# Patient Record
Sex: Female | Born: 1980 | Race: White | Hispanic: No | Marital: Married | Smoking: Never smoker
Health system: Southern US, Community
[De-identification: ages and names within clinical notes are randomized; demographics above are authoritative.]

## PROBLEM LIST (undated history)

## (undated) DIAGNOSIS — I471 Supraventricular tachycardia, unspecified: Secondary | ICD-10-CM

## (undated) HISTORY — DX: Supraventricular tachycardia: I47.1

## (undated) HISTORY — DX: Supraventricular tachycardia, unspecified: I47.10

---
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
## 2008-11-25 ENCOUNTER — Observation Stay: Payer: Self-pay

## 2008-11-28 ENCOUNTER — Inpatient Hospital Stay: Payer: Self-pay

## 2008-12-05 ENCOUNTER — Observation Stay: Payer: Self-pay | Admitting: Obstetrics & Gynecology

## 2008-12-09 ENCOUNTER — Observation Stay: Payer: Self-pay

## 2008-12-12 ENCOUNTER — Observation Stay: Payer: Self-pay | Admitting: Obstetrics and Gynecology

## 2008-12-19 ENCOUNTER — Observation Stay: Payer: Self-pay

## 2008-12-23 ENCOUNTER — Observation Stay: Payer: Self-pay | Admitting: Obstetrics and Gynecology

## 2008-12-26 ENCOUNTER — Inpatient Hospital Stay: Payer: Self-pay | Admitting: Unknown Physician Specialty

## 2010-11-17 ENCOUNTER — Emergency Department: Payer: Self-pay | Admitting: Emergency Medicine

## 2010-11-19 ENCOUNTER — Encounter: Payer: Self-pay | Admitting: Cardiovascular Disease

## 2010-11-19 ENCOUNTER — Ambulatory Visit (INDEPENDENT_AMBULATORY_CARE_PROVIDER_SITE_OTHER): Payer: PRIVATE HEALTH INSURANCE | Admitting: Cardiovascular Disease

## 2010-11-19 DIAGNOSIS — R079 Chest pain, unspecified: Secondary | ICD-10-CM

## 2010-11-19 NOTE — Progress Notes (Signed)
   Patient ID: Leslie Chapman, female    DOB: Jan 09, 1981, 30 y.o.   MRN: [id number]  HPI Comments: Leslie Chapman is a very pleasant 30 year old woman with no significant cardiac history, mother of 4 children, who works third shift at the [employer], who presents for followup after recent episode of chest tightness.   She reports that over the course of the past week or so, she has had stuttering chest tightness. She presented to the emergency room after episode started on Sunday into Monday. She described it as a squeezing in the upper part of her mediastinum radiating bilaterally. She has had more mild symptoms of this like a pill was stuck in her throat, in the past. Workup in the hospital was essentially negative. She had just got back from the beach and had a borderline elevated d-dimer, negative CTA of the chest, negative cardiac enzymes, normal EKG. Other lab work looked essentially normal.  She continues to have mild symptoms through the week. No exacerbation with exertion. She continues to take care of 4 children. No significant exacerbation of her symptoms with eating.  EKG shows normal sinus rhythm with rate 90 beats per minute with no significant ST or T wave changes     Review of Systems  Constitutional: Negative.   HENT: Negative.   Eyes: Negative.   Respiratory: Positive for chest tightness.   Cardiovascular: Positive for chest pain.  Gastrointestinal: Negative.   Musculoskeletal: Negative.   Skin: Negative.   Neurological: Negative.   Hematological: Negative.   Psychiatric/Behavioral: Negative.   All other systems reviewed and are negative.    BP 136/83  Pulse 88  Ht 5\' 4"  (1.626 m)  Wt 220 lb (99.791 kg)  BMI 37.76 kg/m2  Physical Exam  Nursing note and vitals reviewed. Constitutional: She is oriented to person, place, and time. She appears well-developed and well-nourished.  HENT:  Head: Normocephalic.  Nose: Nose normal.  Mouth/Throat: Oropharynx is clear  and moist.  Eyes: Conjunctivae are normal. Pupils are equal, round, and reactive to light.  Neck: Normal range of motion. Neck supple. No JVD present.  Cardiovascular: Normal rate, regular rhythm, S1 normal, S2 normal, normal heart sounds and intact distal pulses.  Exam reveals no gallop and no friction rub.   No murmur heard. Pulmonary/Chest: Effort normal and breath sounds normal. No respiratory distress. She has no wheezes. She has no rales. She exhibits no tenderness.  Abdominal: Soft. Bowel sounds are normal. She exhibits no distension. There is no tenderness.  Musculoskeletal: Normal range of motion. She exhibits no edema and no tenderness.  Lymphadenopathy:    She has no cervical adenopathy.  Neurological: She is alert and oriented to person, place, and time. Coordination normal.  Skin: Skin is warm and dry. No rash noted. No erythema.  Psychiatric: She has a normal mood and affect. Her behavior is normal. Judgment and thought content normal.         Assessment and Plan

## 2010-11-19 NOTE — Patient Instructions (Addendum)
Please try omeprazole/prevacid one a day (could try two a day if bad) Try this for a month (take 1 to 2 weeks to work) Zantac and pepcid will wear off in several hours (you can double the dose)  You can try aleve or advil for chest discomfort (this will help if symptoms are from musculoskeletal problem) Please call us if you have new issues that need to be addressed   Follow up as needed if symptoms do not improve.

## 2010-11-19 NOTE — Assessment & Plan Note (Signed)
Chest pain is likely noncardiac. Symptoms come on at rest. There is more GI symptoms that she details. We have suggested she try proton pump inhibitors, possibly even H2 blockers in combination for her symptoms. She does report some discomfort with palpation of her chest and she could try NSAIDs such as Aleve or Advil for short periods of time. Unable to exclude esophageal spasm or hiatal hernia, esophagitis or gastritis. If she does have spasm with no relief on PPI, we could try ca channel blockers or long-acting nitrates. I have asked her to keep in touch with our office if her symptoms do not improve. If needed, we can perform a treadmill study.

## 2011-01-06 ENCOUNTER — Encounter: Payer: Self-pay | Admitting: Cardiovascular Disease

## 2014-06-03 ENCOUNTER — Ambulatory Visit: Payer: Self-pay | Admitting: Registered Nurse

## 2014-12-04 ENCOUNTER — Ambulatory Visit (INDEPENDENT_AMBULATORY_CARE_PROVIDER_SITE_OTHER): Payer: 59 | Admitting: Family Medicine

## 2014-12-04 ENCOUNTER — Encounter: Payer: Self-pay | Admitting: Family Medicine

## 2014-12-04 VITALS — BP 129/88 | HR 109 | Temp 98.7°F | Ht 64.75 in | Wt 252.0 lb

## 2014-12-04 DIAGNOSIS — R000 Tachycardia, unspecified: Secondary | ICD-10-CM

## 2014-12-04 DIAGNOSIS — R600 Localized edema: Secondary | ICD-10-CM | POA: Diagnosis not present

## 2014-12-04 DIAGNOSIS — Z0000 Encounter for general adult medical examination without abnormal findings: Secondary | ICD-10-CM | POA: Insufficient documentation

## 2014-12-04 DIAGNOSIS — Z3481 Encounter for supervision of other normal pregnancy, first trimester: Secondary | ICD-10-CM

## 2014-12-04 DIAGNOSIS — R0789 Other chest pain: Secondary | ICD-10-CM | POA: Diagnosis not present

## 2014-12-04 DIAGNOSIS — Z3491 Encounter for supervision of normal pregnancy, unspecified, first trimester: Secondary | ICD-10-CM | POA: Insufficient documentation

## 2014-12-04 DIAGNOSIS — Z862 Personal history of diseases of the blood and blood-forming organs and certain disorders involving the immune mechanism: Secondary | ICD-10-CM | POA: Diagnosis not present

## 2014-12-04 LAB — CBC WITH DIFFERENTIAL/PLATELET
HEMATOCRIT: 36.9 % (ref 34.0–46.6)
HEMOGLOBIN: 12.9 g/dL (ref 11.1–15.9)
Lymphocytes Absolute: 0.9 10*3/uL (ref 0.7–3.1)
Lymphs: 20 %
MCH: 31 pg (ref 26.6–33.0)
MCHC: 35 g/dL (ref 31.5–35.7)
MCV: 88 fL (ref 79–97)
MID (Absolute): 0.2 10*3/uL (ref 0.1–1.6)
MID: 5 %
NEUTROS PCT: 76 %
Neutrophils Absolute: 3.6 10*3/uL (ref 1.4–7.0)
Platelets: 209 10*3/uL (ref 150–379)
RBC: 4.16 x10E6/uL (ref 3.77–5.28)
RDW: 13.8 % (ref 12.3–15.4)
WBC: 4.7 10*3/uL (ref 3.4–10.8)

## 2014-12-04 NOTE — Assessment & Plan Note (Signed)
Chewable multivitamins; appt with OB next week

## 2014-12-04 NOTE — Assessment & Plan Note (Signed)
Echocardiogram ordered to evaluate LVEF; more likely related to prolonged sitting and sodium intake; glad that she has reduced both

## 2014-12-04 NOTE — Patient Instructions (Addendum)
We'll refer you for an echocardiogram and a holter monitor Do take two of the Flintstone chewables until you see the OB doctor for further directions We'll contact you about lab results Try DASH guidelines  DASH Eating Plan DASH stands for "Dietary Approaches to Stop Hypertension." The DASH eating plan is a healthy eating plan that has been shown to reduce high blood pressure (hypertension). Additional health benefits may include reducing the risk of type 2 diabetes mellitus, heart disease, and stroke. The DASH eating plan may also help with weight loss. WHAT DO I NEED TO KNOW ABOUT THE DASH EATING PLAN? For the DASH eating plan, you will follow these general guidelines:  Choose foods with a percent daily value for sodium of less than 5% (as listed on the food label).  Use salt-free seasonings or herbs instead of table salt or sea salt.  Check with your health care provider or pharmacist before using salt substitutes.  Eat lower-sodium products, often labeled as "lower sodium" or "no salt added."  Eat fresh foods.  Eat more vegetables, fruits, and low-fat dairy products.  Choose whole grains. Look for the word "whole" as the first word in the ingredient list.  Choose fish and skinless chicken or [place] more often than red meat. Limit fish, poultry, and meat to 6 oz (170 g) each day.  Limit sweets, desserts, sugars, and sugary drinks.  Choose heart-healthy fats.  Limit cheese to 1 oz (28 g) per day.  Eat more home-cooked food and less restaurant, buffet, and fast food.  Limit fried foods.  Cook foods using methods other than frying.  Limit canned vegetables. If you do use them, rinse them well to decrease the sodium.  When eating at a restaurant, ask that your food be prepared with less salt, or no salt if possible. WHAT FOODS CAN I EAT? Seek help from a dietitian for individual calorie needs. Grains Whole grain or whole wheat bread. Brown rice. Whole grain or whole wheat  pasta. Quinoa, bulgur, and whole grain cereals. Low-sodium cereals. Corn or whole wheat flour tortillas. Whole grain cornbread. Whole grain crackers. Low-sodium crackers. Vegetables Fresh or frozen vegetables (raw, steamed, roasted, or grilled). Low-sodium or reduced-sodium tomato and vegetable juices. Low-sodium or reduced-sodium tomato sauce and paste. Low-sodium or reduced-sodium canned vegetables.  Fruits All fresh, canned (in natural juice), or frozen fruits. Meat and Other Protein Products Ground beef (85% or leaner), grass-fed beef, or beef trimmed of fat. Skinless chicken or [place]. Ground chicken or [place]. Pork trimmed of fat. All fish and seafood. Eggs. Dried beans, peas, or lentils. Unsalted nuts and seeds. Unsalted canned beans. Dairy Low-fat dairy products, such as skim or 1% milk, 2% or reduced-fat cheeses, low-fat ricotta or cottage cheese, or plain low-fat yogurt. Low-sodium or reduced-sodium cheeses. Fats and Oils Tub margarines without trans fats. Light or reduced-fat mayonnaise and salad dressings (reduced sodium). Avocado. Safflower, olive, or canola oils. Natural peanut or almond butter. Other Unsalted popcorn and pretzels. The items listed above may not be a complete list of recommended foods or beverages. Contact your dietitian for more options. WHAT FOODS ARE NOT RECOMMENDED? Grains White bread. White pasta. White rice. Refined cornbread. Bagels and croissants. Crackers that contain trans fat. Vegetables Creamed or fried vegetables. Vegetables in a cheese sauce. Regular canned vegetables. Regular canned tomato sauce and paste. Regular tomato and vegetable juices. Fruits Dried fruits. Canned fruit in light or heavy syrup. Fruit juice. Meat and Other Protein Products Fatty cuts of meat. Ribs, chicken wings,  bacon, sausage, bologna, salami, chitterlings, fatback, hot dogs, bratwurst, and packaged luncheon meats. Salted nuts and seeds. Canned beans with salt. Dairy Whole  or 2% milk, cream, half-and-half, and cream cheese. Whole-fat or sweetened yogurt. Full-fat cheeses or blue cheese. Nondairy creamers and whipped toppings. Processed cheese, cheese spreads, or cheese curds. Condiments Onion and garlic salt, seasoned salt, table salt, and sea salt. Canned and packaged gravies. Worcestershire sauce. Tartar sauce. Barbecue sauce. Teriyaki sauce. Soy sauce, including reduced sodium. Steak sauce. Fish sauce. Oyster sauce. Cocktail sauce. Horseradish. Ketchup and mustard. Meat flavorings and tenderizers. Bouillon cubes. Hot sauce. Tabasco sauce. Marinades. Taco seasonings. Relishes. Fats and Oils Butter, stick margarine, lard, shortening, ghee, and bacon fat. Coconut, palm kernel, or palm oils. Regular salad dressings. Other Pickles and olives. Salted popcorn and pretzels. The items listed above may not be a complete list of foods and beverages to avoid. Contact your dietitian for more information. WHERE CAN I FIND MORE INFORMATION? National Heart, Lung, and Blood Institute: travelstabloid.com Document Released: 03/04/2011 Document Revised: 07/30/2013 Document Reviewed: 01/17/2013 [facility] Patient Information 2015 [place], [place]. This information is not intended to replace advice given to you by your health care provider. Make sure you discuss any questions you have with your health care provider.

## 2014-12-04 NOTE — Assessment & Plan Note (Addendum)
CBC checked today, no anemia; ferritin pending; she does not get much dark, green leafy vegetables

## 2014-12-04 NOTE — Assessment & Plan Note (Signed)
EKG done today; will get holter monitor and echo; she already saw cardiologist a few years ago; will evaluate for MVP

## 2014-12-04 NOTE — Progress Notes (Signed)
BP 129/88 mmHg  Pulse 109  Temp(Src) 98.7 F (37.1 C)  Ht 5' 4.75" (1.645 m)  Wt 252 lb (114.306 kg)  BMI 42.24 kg/m2  SpO2 97%   Subjective:    Patient ID: Leslie Chapman, female    DOB: 09/18/1980, 34 y.o.   MRN: [id number]  HPI: Leslie Chapman is a 34 y.o. female  Chief Complaint  Patient presents with  . Establish Care  . Edema    bilateral ankles, works on the weekends  . Tachycardia    x 6 months or so   She is here to establish care She could feel her heart pounding, rate of 120 sitting there doing nothing She tried to drink a lot of fluids Last Saturday, period was late; not very regular anyway so she got a test and found out she was pregnant She has appt with OB next week Rarely will flutter, but mostly just pounding and hard, working hard and going really fast; going on for 6 months Hair will fall out, not abnormal; might be thinning a little in the front; face is very oily; bowel movements regular; doesn't go every day but maybe every other day No known thyroid trouble; her father has heart attacks, he was 51 or 11 years old; no known fam hx of pacemakers She does get chest pain at times; not sure how to describe it; not a particular time; does not last long; does not usually exercise, "I am overweight and I'm very aware of that"; she had some nausea the other night but that might have been the pregnancy Takes a while to get back to normal after three 12 hour shifts in a row No regular meds Does not swallow pills regularly; taking Flintstone chewable The swelling in her ankles is worse on the weekends; notices if riding in a car for a long time; takes 2 days to resolve after she has worked three 12 hour shifts; shoes are tight; not really different eating; cut out soft drinks; stopped adding salt to the food and really no change; does not get up much at her desk I reviewed chart; she saw Dr. Rockey Situ for chest pain in 2012; his assessment for chest pain was  reviewed by me today She says she gets frequent strep throat; had Group B last time; felt different from her usual Group A strep, she could tell the difference She weighs at work; was 259 pounds last time; same kind of scale, not sure about the 7 pounds difference unless it was all swelling in her legs  Relevant past medical, surgical, family and social history reviewed and updated as indicated. Interim medical history since our last visit reviewed. Allergies and medications reviewed and updated.  Review of Systems  Per HPI unless specifically indicated above     Objective:    BP 129/88 mmHg  Pulse 109  Temp(Src) 98.7 F (37.1 C)  Ht 5' 4.75" (1.645 m)  Wt 252 lb (114.306 kg)  BMI 42.24 kg/m2  SpO2 97%  Wt Readings from Last 3 Encounters:  12/04/14 252 lb (114.306 kg)  11/19/10 220 lb (99.791 kg)    Physical Exam  Constitutional: She appears well-developed and well-nourished. No distress.  Morbidly obese  HENT:  Head: Normocephalic and atraumatic.  Eyes: EOM are normal. No scleral icterus.  Neck: No thyromegaly present.  Cardiovascular: Normal rate, regular rhythm and normal heart sounds.   No murmur heard. Pulmonary/Chest: Effort normal and breath sounds normal. No respiratory distress. She  has no wheezes.  Abdominal: Soft. Bowel sounds are normal. She exhibits no distension.  Musculoskeletal: Normal range of motion. She exhibits edema (nonpitting lower extremity edema).  Neurological: She is alert. She displays no tremor. She exhibits normal muscle tone. Gait normal.  Reflex Scores:      Patellar reflexes are 1+ on the right side and 1+ on the left side. Skin: Skin is warm and dry. No rash noted. She is not diaphoretic. No erythema. No pallor.  Psychiatric: She has a normal mood and affect. Her behavior is normal. Judgment and thought content normal.   No results found for this or any previous visit.    Assessment & Plan:   Problem List Items Addressed This Visit       Other   Tachycardia - Primary    EKG done today; no delta waves; rate just under 100 during auscultation; will get holter monitor; limit caffeine, check thyroid function; she is not anemic      Relevant Orders   EKG 12-Lead (Completed)   CBC With Differential/Platelet   TSH   T4, free   Holter monitor - 48 hour   Echocardiogram   Preventative health care    Just entered for lab draw; complete physical was not done      Relevant Orders   Comprehensive metabolic panel   Lipid Panel w/o Chol/HDL Ratio   Bilateral leg edema    Echocardiogram ordered to evaluate LVEF; more likely related to prolonged sitting and sodium intake; glad that she has reduced both      Relevant Orders   Echocardiogram   History of anemia    CBC checked today, no anemia; ferritin pending; she does not get much dark, green leafy vegetables      Relevant Orders   CBC With Differential/Platelet   Ferritin   Atypical chest pain    EKG done today; will get holter monitor and echo; she already saw cardiologist a few years ago; will evaluate for MVP      Relevant Orders   Holter monitor - 48 hour   Echocardiogram   Pregnant and not yet delivered in first trimester    Chewable multivitamins; appt with OB next week      Morbid obesity      Follow up plan: No Follow-up on file.  An after-visit summary was printed and given to the patient at [place].  Please see the patient instructions which may contain other information and recommendations beyond what is mentioned above in the assessment and plan. Orders Placed This Encounter  Procedures  . CBC With Differential/Platelet  . TSH  . Comprehensive metabolic panel  . Lipid Panel w/o Chol/HDL Ratio  . T4, free  . Ferritin  . Holter monitor - 48 hour  . EKG 12-Lead  . Echocardiogram

## 2014-12-04 NOTE — Assessment & Plan Note (Signed)
EKG done today; no delta waves; rate just under 100 during auscultation; will get holter monitor; limit caffeine, check thyroid function; she is not anemic

## 2014-12-04 NOTE — Assessment & Plan Note (Signed)
Just entered for lab draw; complete physical was not done

## 2014-12-05 LAB — LIPID PANEL W/O CHOL/HDL RATIO
Cholesterol, Total: 167 mg/dL (ref 100–199)
HDL: 58 mg/dL (ref >39)
LDL Calculated: 79 mg/dL (ref 0–99)
TRIGLYCERIDES: 148 mg/dL (ref 0–149)
VLDL Cholesterol Cal: 30 mg/dL (ref 5–40)

## 2014-12-05 LAB — COMPREHENSIVE METABOLIC PANEL
A/G RATIO: 1.4 (ref 1.1–2.5)
ALT: 14 IU/L (ref 0–32)
AST: 11 IU/L (ref 0–40)
Albumin: 4 g/dL (ref 3.5–5.5)
Alkaline Phosphatase: 85 IU/L (ref 39–117)
BILIRUBIN TOTAL: 0.2 mg/dL (ref 0.0–1.2)
BUN/Creatinine Ratio: 12 (ref 8–20)
BUN: 8 mg/dL (ref 6–20)
CALCIUM: 9.3 mg/dL (ref 8.7–10.2)
CHLORIDE: 100 mmol/L (ref 97–108)
CO2: 25 mmol/L (ref 18–29)
Creatinine, Ser: 0.66 mg/dL (ref 0.57–1.00)
GFR calc Af Amer: 134 mL/min/{1.73_m2} (ref >59)
GFR calc non Af Amer: 116 mL/min/{1.73_m2} (ref >59)
GLUCOSE: 109 mg/dL — AB (ref 65–99)
Globulin, Total: 2.8 g/dL (ref 1.5–4.5)
POTASSIUM: 4 mmol/L (ref 3.5–5.2)
Sodium: 138 mmol/L (ref 134–144)
Total Protein: 6.8 g/dL (ref 6.0–8.5)

## 2014-12-05 LAB — TSH: TSH: 1.78 u[IU]/mL (ref 0.450–4.500)

## 2014-12-05 LAB — FERRITIN: FERRITIN: 24 ng/mL (ref 15–150)

## 2014-12-05 LAB — T4, FREE: FREE T4: 1.23 ng/dL (ref 0.82–1.77)

## 2014-12-07 ENCOUNTER — Encounter: Payer: Self-pay | Admitting: Family Medicine

## 2014-12-11 ENCOUNTER — Ambulatory Visit
Admission: RE | Admit: 2014-12-11 | Discharge: 2014-12-11 | Disposition: A | Discharge status: Home / Self Care | Payer: 59 | Source: Ambulatory Visit | Attending: Family Medicine | Admitting: Family Medicine

## 2014-12-11 ENCOUNTER — Ambulatory Visit (HOSPITAL_BASED_OUTPATIENT_CLINIC_OR_DEPARTMENT_OTHER)
Admission: RE | Admit: 2014-12-11 | Discharge: 2014-12-11 | Disposition: A | Discharge status: Home / Self Care | Payer: 59 | Source: Ambulatory Visit | Attending: Family Medicine | Admitting: Family Medicine

## 2014-12-11 DIAGNOSIS — R000 Tachycardia, unspecified: Secondary | ICD-10-CM | POA: Diagnosis not present

## 2014-12-11 DIAGNOSIS — R0789 Other chest pain: Secondary | ICD-10-CM | POA: Diagnosis not present

## 2014-12-11 DIAGNOSIS — R600 Localized edema: Secondary | ICD-10-CM | POA: Diagnosis present

## 2014-12-11 NOTE — Progress Notes (Signed)
*  PRELIMINARY RESULTS* Echocardiogram 2D Echocardiogram has been performed.  Leslie Chapman 12/11/2014, 12:40 PM

## 2014-12-20 ENCOUNTER — Other Ambulatory Visit: Payer: Self-pay | Admitting: Obstetrics and Gynecology

## 2014-12-20 DIAGNOSIS — Z3491 Encounter for supervision of normal pregnancy, unspecified, first trimester: Secondary | ICD-10-CM

## 2015-01-09 ENCOUNTER — Ambulatory Visit: Payer: PRIVATE HEALTH INSURANCE

## 2015-07-08 ENCOUNTER — Ambulatory Visit (INDEPENDENT_AMBULATORY_CARE_PROVIDER_SITE_OTHER): Payer: 59 | Admitting: Family Medicine

## 2015-07-08 ENCOUNTER — Encounter: Payer: Self-pay | Admitting: Family Medicine

## 2015-07-08 VITALS — BP 128/82 | HR 105 | Temp 98.4°F | Resp 16 | Wt 253.0 lb

## 2015-07-08 DIAGNOSIS — R234 Changes in skin texture: Secondary | ICD-10-CM | POA: Diagnosis not present

## 2015-07-08 DIAGNOSIS — D485 Neoplasm of uncertain behavior of skin: Secondary | ICD-10-CM | POA: Insufficient documentation

## 2015-07-08 NOTE — Progress Notes (Signed)
   BP 128/82 mmHg  Pulse 105  Temp(Src) 98.4 F (36.9 C) (Oral)  Resp 16  Wt 253 lb (114.76 kg)  SpO2 97%  LMP 06/18/2015 (Approximate)  Breastfeeding? Unknown   Subjective:    Patient ID: Leslie Chapman, female    DOB: 12/18/1980, 35 y.o.   MRN: [id number]  HPI: Leslie Chapman is a 35 y.o. female  Chief Complaint  Patient presents with  . Rash    onset 2 weeks.  Patient denies any trauma or pain.  Bilateral breast, describes as red spots.  CC: changes of skin on breasts (MD note)  Patient is here for evaluation of changes she has noticed in the skin of her breasts She has some places on both breasts that appears slightly darker than the surrounding skin; started on the left breast, now on the right breast as well; no pruritus; no nipple discharge; no pain  Relevant past medical history reviewed No past medical history on file. MD Notes: obesity  Allergies and medications reviewed  Review of Systems Per HPI unless specifically indicated above     Objective:    BP 128/82 mmHg  Pulse 105  Temp(Src) 98.4 F (36.9 C) (Oral)  Resp 16  Wt 253 lb (114.76 kg)  SpO2 97%  LMP 06/18/2015 (Approximate)  Breastfeeding? Unknown  Wt Readings from Last 3 Encounters:  07/08/15 253 lb (114.76 kg)  12/04/14 252 lb (114.306 kg)  11/19/10 220 lb (99.791 kg)    Physical Exam  Constitutional: She appears well-developed and well-nourished.  obese  Cardiovascular: Tachycardia present.   Pulmonary/Chest: Effort normal.  Skin: Skin is warm, dry and intact. Lesion (dark brown irregular nevus (macular) left side upper abdomen) noted. No bruising noted. She is not diaphoretic. No pallor.  Slightly hyperpigmented coloration of patches on both breasts; asymmetric; no discharge  Psychiatric: She has a normal mood and affect.      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  Orders Placed This Encounter  Procedures  . Ambulatory referral to Dermatology   An after-visit summary was printed and given to the patient at [place].  Please see the patient instructions which may contain other information and recommendations beyond what is mentioned above in the assessment and plan.

## 2015-07-08 NOTE — Patient Instructions (Signed)
We'll have you see the dermatologist about the mole on your abdomen We'll also ask them to take a look at the skin changes on your breasts Check your breasts every month, thorough self-breast exams, and notify me right away of any changes Think about anything that may be touching the skin of your breasts such as dryer sheets or detergents that you are using to wash your bras, etc. Return in one month for recheck breast exam, unless the places have completely cleared (in which case you can cancel your appointment)

## 2015-07-27 NOTE — Assessment & Plan Note (Signed)
Dark irregular nevus; refer to dermatologist for evaluation, consideration of excisional biopsy

## 2015-07-27 NOTE — Assessment & Plan Note (Signed)
Does not appear consistent with Paget's, but any change in the skin over the breasts warrants evaluation; refer to dermatologist

## 2015-08-07 ENCOUNTER — Encounter: Payer: Self-pay | Admitting: Family Medicine

## 2015-08-07 ENCOUNTER — Ambulatory Visit (INDEPENDENT_AMBULATORY_CARE_PROVIDER_SITE_OTHER): Payer: 59 | Admitting: Family Medicine

## 2015-08-07 VITALS — BP 132/84 | HR 114 | Temp 98.8°F | Resp 16 | Wt 255.3 lb

## 2015-08-07 DIAGNOSIS — R234 Changes in skin texture: Secondary | ICD-10-CM | POA: Diagnosis not present

## 2015-08-07 DIAGNOSIS — D485 Neoplasm of uncertain behavior of skin: Secondary | ICD-10-CM

## 2015-08-07 NOTE — Assessment & Plan Note (Signed)
Refer to surgeon for evaluation

## 2015-08-07 NOTE — Patient Instructions (Signed)
We'll have you see the surgeon We'll get the mammogram Monthly self-breast exams

## 2015-08-07 NOTE — Progress Notes (Signed)
BP 132/84 mmHg  Pulse 114  Temp(Src) 98.8 F (37.1 C) (Oral)  Resp 16  Wt 255 lb 4.8 oz (115.803 kg)  SpO2 94%  LMP 07/21/2015   Subjective:    Patient ID: Leslie Chapman, female    DOB: 16-Feb-1981, 35 y.o.   MRN: [id number]  HPI: [name] is a 35 y.o. female  Chief Complaint  Patient presents with  . Follow-up    1 month breast changes   First appeared March 29th; started on the left breast and then on the right breast after one week; wears sports bra; no changes in hygiene; no nipple discharge Hx of sensitive skin, using Dove; no itching, no hives Was referred to derm for this and abnormal mole on her abdomen, and never heard anything about the appointment  Depression screen [facility] 2/9 07/08/2015  Decreased Interest 0  Down, Depressed, Hopeless 0  PHQ - 2 Score 0   Relevant past medical, surgical, family and social history reviewed No past medical history on file.  MD note: morbid obesity  Past Surgical History  Procedure Laterality Date  . Cesarean section  2010    x 1   Family History  Problem Relation Age of Onset  . Hypertension Father   . Heart attack Father   . Cancer Father     lung, prostate  . Diabetes Father   . Heart disease Father   . Hypertension Brother   . COPD Mother   . Stroke Neg Hx    Interim medical history since last visit reviewed. Allergies and medications reviewed  Review of Systems Per HPI unless specifically indicated above     Objective:    BP 132/84 mmHg  Pulse 114  Temp(Src) 98.8 F (37.1 C) (Oral)  Resp 16  Wt 255 lb 4.8 oz (115.803 kg)  SpO2 94%  LMP 07/21/2015  Wt Readings from Last 3 Encounters:  08/13/15 256 lb (116.121 kg)  08/07/15 255 lb 4.8 oz (115.803 kg)  07/08/15 253 lb (114.76 kg)   body mass index is 42.79 kg/(m^2).  Physical Exam  Constitutional: She appears well-developed and well-nourished.  Morbidly obese  Cardiovascular: Tachycardia present.   Pulmonary/Chest: Effort normal.    Abdominal: She exhibits no distension.  Skin: Skin is warm and intact. Lesion (dark brown irregular macular nevus left side upper abdomen) noted. No bruising noted. She is not diaphoretic.  Slightly hyperpigmented coloration of patches on both breasts; asymmetric; no discharge  Psychiatric: She has a normal mood and affect.      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  An after-visit summary was printed and given to the patient at [place].  Please see the patient instructions which may contain other information and recommendations beyond what is mentioned above in the assessment and plan.  Orders Placed This Encounter  Procedures  . MM Digital Diagnostic Bilat  . Ambulatory referral to General Surgery

## 2015-08-13 ENCOUNTER — Ambulatory Visit (INDEPENDENT_AMBULATORY_CARE_PROVIDER_SITE_OTHER): Payer: 59 | Admitting: General Surgery

## 2015-08-13 ENCOUNTER — Encounter: Payer: Self-pay | Admitting: General Surgery

## 2015-08-13 VITALS — BP 132/68 | HR 92 | Resp 12 | Ht 66.0 in | Wt 256.0 lb

## 2015-08-13 DIAGNOSIS — N6459 Other signs and symptoms in breast: Secondary | ICD-10-CM | POA: Diagnosis not present

## 2015-08-13 NOTE — Progress Notes (Addendum)
Patient ID: Leslie Chapman, female   DOB: 07/21/1980, 35 y.o.   MRN: [id number]  Chief Complaint  Patient presents with  . Other    Breast skin changes    HPI Leslie Chapman is a 35 y.o. female here today for an evaluation of bilateral breast skin changes. She first noticed this in March 2017, the color varies from purple to light pink. The discoloration is located near her right and left nipples. Denies injury to the breast and no tenderness or lumps. No mammograms. 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 No family history breast cancer. I have reviewed the history of present illness with the patient.   HPI  History reviewed. No pertinent past medical history.  Past Surgical History  Procedure Laterality Date  . Cesarean section  2010    x 1    Family History  Problem Relation Age of Onset  . Hypertension Father   . Heart attack Father   . Cancer Father     lung, prostate  . Diabetes Father   . Heart disease Father   . Hypertension Brother   . COPD Mother   . Stroke Neg Hx     Social History Social History  Substance Use Topics  . Smoking status: Never Smoker   . Smokeless tobacco: Never Used  . Alcohol Use: No    No Known Allergies  No current outpatient prescriptions on file.   No current facility-administered medications for this visit.    Review of Systems Review of Systems  Constitutional: Negative.   Respiratory: Negative.   Cardiovascular: Negative.     Blood pressure 132/68, pulse 92, resp. rate 12, height 5\' 6"  (1.676 m), weight 256 lb (116.121 kg), last menstrual period 07/21/2015, unknown if currently breastfeeding.  Physical Exam Physical Exam  Constitutional: She is oriented to person, place, and time. She appears well-developed and well-nourished.  Eyes: Conjunctivae are normal. No scleral icterus.  Neck: Neck supple.  Cardiovascular: Normal rate, regular rhythm and normal heart sounds.   Pulmonary/Chest: Effort normal and breath  sounds normal. Right breast exhibits no inverted nipple, no mass, no nipple discharge, no skin change and no tenderness. Left breast exhibits no inverted nipple, no mass, no nipple discharge, no skin change and no tenderness.  Lymphadenopathy:    She has no cervical adenopathy.    She has no axillary adenopathy.  Neurological: She is alert and oriented to person, place, and time.  Skin: Skin is warm and dry.  On the undersurface of both breasts few 2 mm pink spots are noted- likely sweat glands, no signs of infection. Left breast has a small area medial location with slightest pinkish hue. No skin thickening noted.  Data Reviewed Notes reviewed.  Assessment    No significant findings in breasts. Pt reassured. Advised she call if the skin changes become more prominent and or she has other breast symptoms.. No need for mammogram now. She will need a baseline mammogram in 2 yrs at age 88    Plan    Follow up as needed. The patient is aware to call back for any questions or concerns.     This information has been scribed by Verlene Mayer, CMA    PCP: Dr. Hillery Jacks 08/13/2015, 9:16 AM

## 2015-08-13 NOTE — Patient Instructions (Addendum)
Continue self breast exams. Call office for any new breast issues or concerns. 

## 2015-08-21 ENCOUNTER — Telehealth: Payer: Self-pay | Admitting: Family Medicine

## 2015-08-21 DIAGNOSIS — R234 Changes in skin texture: Secondary | ICD-10-CM

## 2015-08-21 DIAGNOSIS — D485 Neoplasm of uncertain behavior of skin: Secondary | ICD-10-CM

## 2015-08-21 NOTE — Telephone Encounter (Signed)
Patient is being referred to [facility] and she has a few questions for you. Please return call

## 2015-08-25 NOTE — Telephone Encounter (Signed)
I called, left message

## 2015-08-25 NOTE — Assessment & Plan Note (Addendum)
Patient was referred to dermatologist about this and the breast changes but did not hear back; will get her to a surgeon and ask if surgeon or dermatologist can remove this; examiner is not removing moles at this time

## 2015-08-26 ENCOUNTER — Telehealth: Payer: Self-pay | Admitting: Family Medicine

## 2015-08-26 NOTE — Telephone Encounter (Signed)
Pt states returning your call

## 2015-08-26 NOTE — Telephone Encounter (Signed)
Pt would like a call back

## 2015-08-27 NOTE — Telephone Encounter (Signed)
I called, left message

## 2015-08-28 NOTE — Assessment & Plan Note (Signed)
Refer to derm  ?

## 2015-08-28 NOTE — Telephone Encounter (Signed)
I returned call; two open phone messages for same issue

## 2015-08-28 NOTE — Telephone Encounter (Signed)
She went to the surgeon's office; she saw Dr. Dixie Dials put in a new referral to dermatologist for the skin changes on the breast and the dark mole on the abdomen

## 2015-09-01 DIAGNOSIS — Z1283 Encounter for screening for malignant neoplasm of skin: Secondary | ICD-10-CM | POA: Diagnosis not present

## 2015-09-01 DIAGNOSIS — D485 Neoplasm of uncertain behavior of skin: Secondary | ICD-10-CM | POA: Diagnosis not present

## 2015-09-01 DIAGNOSIS — I788 Other diseases of capillaries: Secondary | ICD-10-CM | POA: Diagnosis not present

## 2015-09-01 DIAGNOSIS — D225 Melanocytic nevi of trunk: Secondary | ICD-10-CM | POA: Diagnosis not present

## 2017-06-22 ENCOUNTER — Telehealth: Payer: Self-pay | Admitting: Family Medicine

## 2017-06-22 NOTE — Telephone Encounter (Signed)
Echo report just came to me from September of 2016 I called patient, reached voicemail, left message asking her to call me back

## 2017-06-23 NOTE — Telephone Encounter (Signed)
I called again to go over results from 2016 echo; it was a completely normal study, but it does not appear that the patient was notified I left message asking her to call back to speak with either me or Miel Mock No CRM created

## 2017-06-24 NOTE — Telephone Encounter (Signed)
Copied from [place]. Topic: General - Other >> Jun 23, 2017  1:42 PM Carolyn Stare wrote:  Pt returned Dr Sanda Klein call

## 2017-06-24 NOTE — Telephone Encounter (Signed)
LVM per Dr. Sanda Klein letting patient know that echo from 2016 was normal and apologized for the 80yr delay of getting the results.  Also stated in the message that patient could call back if she had any questions.  I have also updated the CRM.

## 2017-08-24 DIAGNOSIS — L570 Actinic keratosis: Secondary | ICD-10-CM | POA: Diagnosis not present

## 2017-08-24 DIAGNOSIS — D225 Melanocytic nevi of trunk: Secondary | ICD-10-CM | POA: Diagnosis not present

## 2017-08-29 DIAGNOSIS — D225 Melanocytic nevi of trunk: Secondary | ICD-10-CM | POA: Diagnosis not present

## 2017-08-29 DIAGNOSIS — D485 Neoplasm of uncertain behavior of skin: Secondary | ICD-10-CM | POA: Diagnosis not present

## 2017-10-07 ENCOUNTER — Encounter: Payer: 59 | Admitting: Family Medicine

## 2017-11-01 ENCOUNTER — Encounter: Payer: 59 | Admitting: Family Medicine

## 2018-09-15 ENCOUNTER — Telehealth: Payer: Self-pay | Admitting: Cardiovascular Disease

## 2018-09-15 NOTE — Telephone Encounter (Signed)
Pending

## 2018-09-15 NOTE — Telephone Encounter (Deleted)
Virtual Visit Pre-Appointment Phone Call  "(Name), I am calling you today to discuss your upcoming appointment. We are currently trying to limit exposure to the virus that causes COVID-19 by seeing patients at home rather than in the office."  1. "What is the BEST phone number to call the day of the visit?" - include this in appointment notes  2. Do you have or have access to (through a family member/friend) a smartphone with video capability that we can use for your visit?" a. If yes - list this number in appt notes as cell (if different from BEST phone #) and list the appointment type as a VIDEO visit in appointment notes b. If no - list the appointment type as a PHONE visit in appointment notes  3. Confirm consent - "In the setting of the current Covid19 crisis, you are scheduled for a (phone or video) visit with your provider on (date) at (time).  Just as we do with many in-office visits, in order for you to participate in this visit, we must obtain consent.  If you'd like, I can send this to your mychart (if signed up) or email for you to review.  Otherwise, I can obtain your verbal consent now.  All virtual visits are billed to your insurance company just like a normal visit would be.  By agreeing to a virtual visit, we'd like you to understand that the technology does not allow for your provider to perform an examination, and thus may limit your provider's ability to fully assess your condition. If your provider identifies any concerns that need to be evaluated in person, we will make arrangements to do so.  Finally, though the technology is pretty good, we cannot assure that it will always work on either your or our end, and in the setting of a video visit, we may have to convert it to a phone-only visit.  In either situation, we cannot ensure that we have a secure connection.  Are you willing to proceed?" STAFF: Did the patient verbally acknowledge consent to telehealth visit? Document  YES/NO here: ***  4. Advise patient to be prepared - "Two hours prior to your appointment, go ahead and check your blood pressure, pulse, oxygen saturation, and your weight (if you have the equipment to check those) and write them all down. When your visit starts, your provider will ask you for this information. If you have an Apple Watch or Kardia device, please plan to have heart rate information ready on the day of your appointment. Please have a pen and paper handy nearby the day of the visit as well."  5. Give patient instructions for MyChart download to smartphone OR [email or web address] as below if video visit (depending on what platform provider is using)  6. Inform patient they will receive a phone call 15 minutes prior to their appointment time (may be from unknown caller ID) so they should be prepared to answer    TELEPHONE CALL NOTE  Leslie Chapman has been deemed a candidate for a follow-up tele-health visit to limit community exposure during the Covid-19 pandemic. I spoke with the patient via phone to ensure availability of phone/video source, confirm preferred email & phone number, and discuss instructions and expectations.  I reminded Leslie Chapman to be prepared with any vital sign and/or heart rhythm information that could potentially be obtained via home monitoring, at the time of her visit. I reminded Leslie Chapman to expect a phone call prior to  her visit.  Clarisse Gouge 09/15/2018 8:36 AM   INSTRUCTIONS FOR DOWNLOADING THE MYCHART APP TO SMARTPHONE  - The patient must first make sure to have activated MyChart and know their login information - If Apple, go to [employer] and type in MyChart in the search bar and download the app. If Android, ask patient to go to [employer] and type in [place] in the search bar and download the app. The app is free but as with any other app downloads, their phone may require them to verify saved payment information or  Apple/Android password.  - The patient will need to then log into the app with their MyChart username and password, and select [facility] as their healthcare provider to link the account. When it is time for your visit, go to the MyChart app, find appointments, and click Begin Video Visit. Be sure to Select Allow for your device to access the Microphone and Camera for your visit. You will then be connected, and your provider will be with you shortly.  **If they have any issues connecting, or need assistance please contact MyChart service desk [phone])**  **If using a computer, in order to ensure the best quality for their visit they will need to use either of the following Internet Browsers: [employer], or Google Chrome**  IF USING DOXIMITY or DOXY.ME - The patient will receive a link just prior to their visit by text.     FULL LENGTH CONSENT FOR TELE-HEALTH VISIT   I hereby voluntarily request, consent and authorize [facility] and its employed or contracted physicians, physician assistants, nurse practitioners or other licensed health care professionals (the Practitioner), to provide me with telemedicine health care services (the Services") as deemed necessary by the treating Practitioner. I acknowledge and consent to receive the Services by the Practitioner via telemedicine. I understand that the telemedicine visit will involve communicating with the Practitioner through live audiovisual communication technology and the disclosure of certain medical information by electronic transmission. I acknowledge that I have been given the opportunity to request an in-person assessment or other available alternative prior to the telemedicine visit and am voluntarily participating in the telemedicine visit.  I understand that I have the right to withhold or withdraw my consent to the use of telemedicine in the course of my care at any time, without affecting my right to future care  or treatment, and that the Practitioner or I may terminate the telemedicine visit at any time. I understand that I have the right to inspect all information obtained and/or recorded in the course of the telemedicine visit and may receive copies of available information for a reasonable fee.  I understand that some of the potential risks of receiving the Services via telemedicine include:   Delay or interruption in medical evaluation due to technological equipment failure or disruption;  Information transmitted may not be sufficient (e.g. poor resolution of images) to allow for appropriate medical decision making by the Practitioner; and/or   In rare instances, security protocols could fail, causing a breach of personal health information.  Furthermore, I acknowledge that it is my responsibility to provide information about my medical history, conditions and care that is complete and accurate to the best of my ability. I acknowledge that Practitioner's advice, recommendations, and/or decision may be based on factors not within their control, such as incomplete or inaccurate data provided by me or distortions of diagnostic images or specimens that may result from electronic transmissions. I  understand that the practice of medicine is not an exact science and that Practitioner makes no warranties or guarantees regarding treatment outcomes. I acknowledge that I will receive a copy of this consent concurrently upon execution via email to the email address I last provided but may also request a printed copy by calling the office of [facility].    I understand that my insurance will be billed for this visit.   I have read or had this consent read to me.  I understand the contents of this consent, which adequately explains the benefits and risks of the Services being provided via telemedicine.   I have been provided ample opportunity to ask questions regarding this consent and the Services and have had  my questions answered to my satisfaction.  I give my informed consent for the services to be provided through the use of telemedicine in my medical care  By participating in this telemedicine visit I agree to the above.

## 2018-09-28 NOTE — Telephone Encounter (Signed)
Virtual Visit Pre-Appointment Phone Call  "(Name), I am calling you today to discuss your upcoming appointment. We are currently trying to limit exposure to the virus that causes COVID-19 by seeing patients at home rather than in the office."  1. "What is the BEST phone number to call the day of the visit?" - include this in appointment notes  2. Do you have or have access to (through a family member/friend) a smartphone with video capability that we can use for your visit?" a. If yes - list this number in appt notes as cell (if different from BEST phone #) and list the appointment type as a VIDEO visit in appointment notes b. If no - list the appointment type as a PHONE visit in appointment notes  3. Confirm consent - "In the setting of the current Covid19 crisis, you are scheduled for a (phone or video) visit with your provider on (date) at (time).  Just as we do with many in-office visits, in order for you to participate in this visit, we must obtain consent.  If you'd like, I can send this to your mychart (if signed up) or email for you to review.  Otherwise, I can obtain your verbal consent now.  All virtual visits are billed to your insurance company just like a normal visit would be.  By agreeing to a virtual visit, we'd like you to understand that the technology does not allow for your provider to perform an examination, and thus may limit your provider's ability to fully assess your condition. If your provider identifies any concerns that need to be evaluated in person, we will make arrangements to do so.  Finally, though the technology is pretty good, we cannot assure that it will always work on either your or our end, and in the setting of a video visit, we may have to convert it to a phone-only visit.  In either situation, we cannot ensure that we have a secure connection.  Are you willing to proceed?" STAFF: Did the patient verbally acknowledge consent to telehealth visit? Document  YES/NO here: YES  4. Advise patient to be prepared - "Two hours prior to your appointment, go ahead and check your blood pressure, pulse, oxygen saturation, and your weight (if you have the equipment to check those) and write them all down. When your visit starts, your provider will ask you for this information. If you have an Apple Watch or Kardia device, please plan to have heart rate information ready on the day of your appointment. Please have a pen and paper handy nearby the day of the visit as well."  5. Give patient instructions for MyChart download to smartphone OR [email or web address].me as below if video visit (depending on what platform provider is using)  6. Inform patient they will receive a phone call 15 minutes prior to their appointment time (may be from unknown caller ID) so they should be prepared to answer    TELEPHONE CALL NOTE  Leslie Chapman has been deemed a candidate for a follow-up tele-health visit to limit community exposure during the Covid-19 pandemic. I spoke with the patient via phone to ensure availability of phone/video source, confirm preferred email & phone number, and discuss instructions and expectations.  I reminded Leslie Chapman to be prepared with any vital sign and/or heart rhythm information that could potentially be obtained via home monitoring, at the time of her visit. I reminded Leslie Chapman to expect a phone call prior to  her visit.  Saunders Glance Bumgarner 09/28/2018 10:49 AM   INSTRUCTIONS FOR DOWNLOADING THE MYCHART APP TO SMARTPHONE  - The patient must first make sure to have activated MyChart and know their login information - If Apple, go to [employer] and type in MyChart in the search bar and download the app. If Android, ask patient to go to [employer] and type in [place] in the search bar and download the app. The app is free but as with any other app downloads, their phone may require them to verify saved payment information or  [employer]/Android password.  - The patient will need to then log into the app with their MyChart username and password, and select [facility] as their healthcare provider to link the account. When it is time for your visit, go to the MyChart app, find appointments, and click Begin Video Visit. Be sure to Select Allow for your device to access the Microphone and Camera for your visit. You will then be connected, and your provider will be with you shortly.  **If they have any issues connecting, or need assistance please contact MyChart service desk [phone])**  **If using a computer, in order to ensure the best quality for their visit they will need to use either of the following Internet Browsers: [employer], or Google Chrome**  IF USING DOXIMITY or DOXY.ME - The patient will receive a link just prior to their visit by text.     FULL LENGTH CONSENT FOR TELE-HEALTH VISIT   I hereby voluntarily request, consent and authorize [facility] and its employed or contracted physicians, physician assistants, nurse practitioners or other licensed health care professionals (the Practitioner), to provide me with telemedicine health care services (the Services") as deemed necessary by the treating Practitioner. I acknowledge and consent to receive the Services by the Practitioner via telemedicine. I understand that the telemedicine visit will involve communicating with the Practitioner through live audiovisual communication technology and the disclosure of certain medical information by electronic transmission. I acknowledge that I have been given the opportunity to request an in-person assessment or other available alternative prior to the telemedicine visit and am voluntarily participating in the telemedicine visit.  I understand that I have the right to withhold or withdraw my consent to the use of telemedicine in the course of my care at any time, without affecting my right to future care  or treatment, and that the Practitioner or I may terminate the telemedicine visit at any time. I understand that I have the right to inspect all information obtained and/or recorded in the course of the telemedicine visit and may receive copies of available information for a reasonable fee.  I understand that some of the potential risks of receiving the Services via telemedicine include:   Delay or interruption in medical evaluation due to technological equipment failure or disruption;  Information transmitted may not be sufficient (e.g. poor resolution of images) to allow for appropriate medical decision making by the Practitioner; and/or   In rare instances, security protocols could fail, causing a breach of personal health information.  Furthermore, I acknowledge that it is my responsibility to provide information about my medical history, conditions and care that is complete and accurate to the best of my ability. I acknowledge that Practitioner's advice, recommendations, and/or decision may be based on factors not within their control, such as incomplete or inaccurate data provided by me or distortions of diagnostic images or specimens that may result from electronic transmissions. I  understand that the practice of medicine is not an exact science and that Practitioner makes no warranties or guarantees regarding treatment outcomes. I acknowledge that I will receive a copy of this consent concurrently upon execution via email to the email address I last provided but may also request a printed copy by calling the office of [facility].    I understand that my insurance will be billed for this visit.   I have read or had this consent read to me.  I understand the contents of this consent, which adequately explains the benefits and risks of the Services being provided via telemedicine.   I have been provided ample opportunity to ask questions regarding this consent and the Services and have had  my questions answered to my satisfaction.  I give my informed consent for the services to be provided through the use of telemedicine in my medical care  By participating in this telemedicine visit I agree to the above.

## 2018-10-01 NOTE — Progress Notes (Signed)
Virtual Visit via Video Note   This visit type was conducted due to national recommendations for restrictions regarding the COVID-19 Pandemic (e.g. social distancing) in an effort to limit this patient's exposure and mitigate transmission in our community.  Due to her co-morbid illnesses, this patient is at least at moderate risk for complications without adequate follow up.  This format is felt to be most appropriate for this patient at this time.  All issues noted in this document were discussed and addressed.  A limited physical exam was performed with this format.  Please refer to the patient's chart for her consent to telehealth for [facility].   I connected with  Daylene Katayama on 10/02/18 by a video enabled telemedicine application and verified that I am speaking with the correct person using two identifiers. I discussed the limitations of evaluation and management by telemedicine. The patient expressed understanding and agreed to proceed.   Evaluation Performed:  Follow-up visit  Date:  10/02/2018   ID:  Leslie Chapman, DOB 02/04/1981, MRN [id number]  Patient Location:  [place] [address]   Provider location:   [facility], [place] office  PCP:  Arnetha Courser, MD  Cardiologist:  Patsy Baltimore   Chief Complaint: Tachycardia   History of Present Illness:    Leslie Chapman is a 38 y.o. female who presents via audio/video conferencing for a telehealth visit today.   The patient does not symptoms concerning for COVID-19 infection (fever, chills, cough, or new SHORTNESS OF BREATH).   Last seen in 2012 Patient has a past medical history of  no significant cardiac history,  mother of 4 children, who works third shift at [employer], who presents for new patient evaluation, for tachycardia  "had a run of SVT" Pulse ox measured 220 bpm, Vagal maneuver, after several attempts it broke the arrhythmia Again 2 weeks ago had  tachycardia again requiring vagal maneuver Random onset, was at home normal day, rate 180 on the second episode Lasted 3 min, each episode Very symptomatic  Denies excessive coffee, stimulants that could have contributed to her tachycardia Denies significant chest pains  Other past medical history reviewed with her on today's visit  episode of chest tightness in 2012  emergency room after episode squeezing in the upper part of her mediastinum radiating bilaterally.  symptoms of this like a pill was stuck in her throat, in the past. Workup in the hospital was essentially negative.    negative CTA of the chest, negative cardiac enzymes, normal EKG.   Normal echo 11/2014 Normal  Holter in 2016 Normal sinus rhythm. Few PACs. Sinus tachycardia with a normal average heart rate of 88 bpm. Overall, this is a normal Holter monitor.  Prior CV studies:   The following studies were reviewed today:    No past medical history on file. Past Surgical History:  Procedure Laterality Date  . CESAREAN SECTION  2010   x 1     No outpatient medications have been marked as taking for the 10/02/18 encounter (Telemedicine) with Minna Merritts, MD.     Allergies:   Patient has no known allergies.   Social History   Tobacco Use  . Smoking status: Never Smoker  . Smokeless tobacco: Never Used  Substance Use Topics  . Alcohol use: No    Alcohol/week: 0.0 standard drinks  . Drug use: No     No current outpatient medications on file prior to  visit.   No current facility-administered medications on file prior to visit.      Family Hx: The patient's family history includes COPD in her mother; Cancer in her father; Diabetes in her father; Heart attack in her father; Heart disease in her father; Hypertension in her brother and father. There is no history of Stroke.  ROS:   Please see the history of present illness.    Review of Systems  Constitutional: Negative.   HENT: Negative.    Respiratory: Negative.   Cardiovascular: Positive for palpitations.       Tachycardia  Gastrointestinal: Negative.   Musculoskeletal: Negative.   Neurological: Negative.   Psychiatric/Behavioral: Negative.   All other systems reviewed and are negative.     Labs/Other Tests and Data Reviewed:    Recent Labs: No results found for requested labs within last 8760 hours.   Recent Lipid Panel Lab Results  Component Value Date/Time   CHOL 167 12/04/2014 03:43 PM   TRIG 148 12/04/2014 03:43 PM   HDL 58 12/04/2014 03:43 PM   LDLCALC 79 12/04/2014 03:43 PM    Wt Readings from Last 3 Encounters:  10/02/18 245 lb (111.1 kg)  08/13/15 256 lb (116.1 kg)  08/07/15 255 lb 4.8 oz (115.8 kg)     Exam:    Vital Signs: Vital signs may also be detailed in the HPI Ht 5\' 8"  (1.727 m)   Wt 245 lb (111.1 kg)   BMI 37.25 kg/m   Wt Readings from Last 3 Encounters:  10/02/18 245 lb (111.1 kg)  08/13/15 256 lb (116.1 kg)  08/07/15 255 lb 4.8 oz (115.8 kg)   Temp Readings from Last 3 Encounters:  08/07/15 98.8 F (37.1 C) (Oral)  07/08/15 98.4 F (36.9 C) (Oral)  12/04/14 98.7 F (37.1 C)   BP Readings from Last 3 Encounters:  08/13/15 132/68  08/07/15 132/84  07/08/15 128/82   Pulse Readings from Last 3 Encounters:  08/13/15 92  08/07/15 (!) 114  07/08/15 (!) 105     Well nourished, well developed female in no acute distress. Constitutional:  oriented to person, place, and time. No distress.  Head: Normocephalic and atraumatic.  Eyes:  no discharge. No scleral icterus.  Neck: Normal range of motion. Neck supple.  Pulmonary/Chest: No audible wheezing, no distress, appears comfortable Musculoskeletal: Normal range of motion.  no  tenderness or deformity.  Neurological:   Coordination normal. Full exam not performed Skin:  No rash Psychiatric:  normal mood and affect. behavior is normal. Thought content normal.    ASSESSMENT & PLAN:    SVT (supraventricular tachycardia)  ([place]) -  Long discussion with her concerning recent episodes of tachycardia 2 episodes lasting several minutes each time rate between 180 up to 220 Each episode broke with Valsalva We have ordered a Zio monitor in attempt to identify her rhythm, exclude atrial flutter Discussed various medications, prescription sent in for diltiazem 30 mg 3 times daily PRN for prolonged episodes -Discussed other treatment options such as medications on a daily basis, and ablation  Atypical chest pain -  Denies having significant chest pain Previously with some leg edema Normal echocardiogram, Holter in 2016   COVID-19 Education: The signs and symptoms of COVID-19 were discussed with the patient and how to seek care for testing (follow up with PCP or arrange E-visit).  The importance of social distancing was discussed today.  Patient Risk:   After full review of this patients clinical status, I feel that they are at  least moderate risk at this time.  Time:   Today, I have spent 45 minutes with the patient with telehealth technology discussing the cardiac and medical problems/diagnoses detailed above   10 min spent reviewing the chart prior to patient visit today   Medication Adjustments/Labs and Tests Ordered: Current medicines are reviewed at length with the patient today.  Concerns regarding medicines are outlined above.   Tests Ordered: No tests ordered   Medication Changes: No changes made   Disposition: Follow-up as needed   Signed, Ida Rogue, MD  10/02/2018 8:42 AM    [place] Office [address], [place], [place] [address]

## 2018-10-02 ENCOUNTER — Telehealth (INDEPENDENT_AMBULATORY_CARE_PROVIDER_SITE_OTHER): Payer: 59 | Admitting: Cardiovascular Disease

## 2018-10-02 ENCOUNTER — Telehealth: Payer: Self-pay | Admitting: *Deleted

## 2018-10-02 ENCOUNTER — Other Ambulatory Visit: Payer: Self-pay

## 2018-10-02 VITALS — Ht 68.0 in | Wt 245.0 lb

## 2018-10-02 DIAGNOSIS — R002 Palpitations: Secondary | ICD-10-CM | POA: Diagnosis not present

## 2018-10-02 DIAGNOSIS — I471 Supraventricular tachycardia, unspecified: Secondary | ICD-10-CM

## 2018-10-02 DIAGNOSIS — R0789 Other chest pain: Secondary | ICD-10-CM

## 2018-10-02 MED ORDER — DILTIAZEM HCL 30 MG PO TABS: 30.0000 mg | ORAL_TABLET | Freq: Three times a day (TID) | ORAL | 2 refills | Status: AC | PRN

## 2018-10-02 NOTE — Telephone Encounter (Signed)
Called patient to go over AVS from Virtual Visit with Dr Rockey Situ. No answer. Left detailed message, ok per DPR, that I would mail her the AVS, send her Rx to pharmacy, and for her to call back if she had any further questions regarding the monitor or visit.  Registered patient on [facility] website as well.

## 2018-10-02 NOTE — Patient Instructions (Addendum)
We will order a Zio monitor for suspected SVT, Paroxysmal tachycardia   Medication Instructions:  Please take diltiazem 30 mg pill 3 times daily PRN for tachycardia   If you need a refill on your cardiac medications before your next appointment, please call your pharmacy.    Lab work: No new labs needed   If you have labs (blood work) drawn today and your tests are completely normal, you will receive your results only by: Marland Kitchen MyChart Message (if you have MyChart) OR . A paper copy in the mail If you have any lab test that is abnormal or we need to change your treatment, we will call you to review the results.   Testing/Procedures: Your physician has recommended that you wear an 14 day ZIO event monitor. Event monitors are medical devices that record the heart's electrical activity. Doctors most often [place] these monitors to diagnose arrhythmias. Arrhythmias are problems with the speed or rhythm of the heartbeat. The monitor is a small, portable device. You can wear one while you do your normal daily activities. This is usually used to diagnose what is causing palpitations/syncope (passing out).  This will be directly shipped to you from [facility]- they will call you just prior to receipt of the monitor or when you actually receive the monitor.  If you get a call from an 800# or a 224 area code in the next 2-5 business days, then please answer the call.    Follow-Up: At [facility], you and your health needs are our priority.  As part of our continuing mission to provide you with exceptional heart care, we have created designated Provider Care Teams.  These Care Teams include your primary Cardiologist (physician) and Advanced Practice Providers (APPs -  Physician Assistants and Nurse Practitioners) who all work together to provide you with the care you need, when you need it.  . You will need a follow up appointment as needed   . Providers on your designated Care Team:   . Murray Hodgkins, NP . Christell Faith, PA-C . Marrianne Mood, PA-C  Any Other Special Instructions Will Be Listed Below (If Applicable).  For educational health videos Log in to : [email or web address] Or : [email or web address], password : [email or web address]

## 2018-11-07 ENCOUNTER — Other Ambulatory Visit: Payer: Self-pay

## 2018-11-07 ENCOUNTER — Encounter: Payer: Self-pay | Admitting: Family Medicine

## 2018-11-07 ENCOUNTER — Ambulatory Visit (INDEPENDENT_AMBULATORY_CARE_PROVIDER_SITE_OTHER): Payer: 59 | Admitting: Family Medicine

## 2018-11-07 VITALS — BP 122/80 | HR 89 | Temp 97.6°F | Resp 16 | Ht 68.0 in | Wt 251.9 lb

## 2018-11-07 DIAGNOSIS — R600 Localized edema: Secondary | ICD-10-CM

## 2018-11-07 DIAGNOSIS — Z1159 Encounter for screening for other viral diseases: Secondary | ICD-10-CM

## 2018-11-07 DIAGNOSIS — Z0000 Encounter for general adult medical examination without abnormal findings: Secondary | ICD-10-CM | POA: Diagnosis not present

## 2018-11-07 DIAGNOSIS — Z1231 Encounter for screening mammogram for malignant neoplasm of breast: Secondary | ICD-10-CM

## 2018-11-07 DIAGNOSIS — Z131 Encounter for screening for diabetes mellitus: Secondary | ICD-10-CM

## 2018-11-07 NOTE — Progress Notes (Signed)
Name: Leslie Chapman   MRN: [id number]    DOB: 1980-09-15   Date:11/07/2018       Progress Note  Subjective  Chief Complaint  Chief Complaint  Patient presents with  . Annual Exam    HPI  Patient presents for annual CPE.  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  She has SVT that is intermittent, seeing Dr. Rockey Situ, needs to wear holter monitor for 2 weeks, but has not gotten to start this yet.  She is rx'd cardizem PRN. During her appt with Dr. Rockey Situ for SVT on 10/02/2018 she forgot to mention her swelling. The swelling goes down when she sits and elevates the legs for several hours. She is not wearing compression stockings but is willing to start.  Denies shortness of breath her chest pain.  Diet: Has been eating out a lot frequently - having fried foods, carbs - under a lot of stress lately.  Exercise: Has been walking a few times a week.  USPSTF grade A and B recommendations    Office Visit from 11/07/2018 in [facility]  AUDIT-C Score  1    Drinks 2 drinks twice a week  Depression: Phq 9 is  negative Depression screen [facility] 2/9 11/07/2018 07/08/2015  Decreased Interest 0 0  Down, Depressed, Hopeless 0 0  PHQ - 2 Score 0 0  Altered sleeping 0 -  Tired, decreased energy 0 -  Change in appetite 0 -  Feeling bad or failure about yourself  0 -  Trouble concentrating 0 -  Moving slowly or fidgety/restless 0 -  Suicidal thoughts 0 -  PHQ-9 Score 0 -  Difficult doing work/chores Not difficult at all -   Hypertension: BP Readings from Last 3 Encounters:  11/07/18 122/80  08/13/15 132/68  08/07/15 132/84   Obesity: Wt Readings from Last 3 Encounters:  11/07/18 251 lb 14.4 oz (114.3 kg)  10/02/18 245 lb (111.1 kg)  08/13/15 256 lb (116.1 kg)   BMI Readings  from Last 3 Encounters:  11/07/18 38.30 kg/m  10/02/18 37.25 kg/m  08/13/15 41.32 kg/m    Hep C Screening: Due today STD testing and prevention (HIV/chl/gon/syphilis): Negative HIV in 2016, declines repeat labs today. Intimate partner violence: No concerns Sexual History/Pain during Intercourse: No concerns Menstrual History/LMP/Abnormal Bleeding: No concerns Incontinence Symptoms: No concerns  Advanced Care Planning: A voluntary discussion about advance care planning including the explanation and discussion of advance directives.  Discussed health care proxy and Living will, and the patient was able to identify a health care proxy as her husband Mattison Golay).  Patient does not have a living will at present time. If patient does have living will, I have requested they bring this to the clinic to be scanned in to their chart.  Breast cancer: MGM with history. No results found for: [facility]  BRCA gene screening: We will order screening - has never had mammogram in the past.  Cervical cancer screening: Would like to have done with GYN - advised she needs to schedule.  Osteoporosis Screening: No family history that she knows of; gets outside plenty, no history of vitamin D deficiency.  No results found for: HMDEXASCAN  Lipids:  Lab Results  Component Value Date   CHOL  167 12/04/2014   Lab Results  Component Value Date   HDL 58 12/04/2014   Lab Results  Component Value Date   LDLCALC 79 12/04/2014   Lab Results  Component Value Date   TRIG 148 12/04/2014   No results found for: CHOLHDL No results found for: LDLDIRECT  Glucose:  Glucose  Date Value Ref Range Status  12/04/2014 109 (H) 65 - 99 mg/dL Final    Skin cancer: She sees Dr. Phillip Heal annually for body scan Colorectal cancer: Denies family or personal history of colorectal cancer, no changes in BM's - no blood in stool, dark and tarry stool, mucus in stool, or constipation/diarrhea. Lung cancer: Never Smoker.  Low  Dose CT Chest recommended if Age 57-80 years, 30 pack-year currently smoking OR have quit w/in 15years. Patient does not qualify.   ECG: Seeing Dr. Rockey Situ - see above.   Patient Active Problem List   Diagnosis Date Noted  . Breast skin changes 07/08/2015  . 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 Tachycardia 12/04/2014  . Preventative health care 12/04/2014  . Bilateral leg edema 12/04/2014  . History of anemia 12/04/2014  . Atypical chest pain 12/04/2014  . Pregnant and not yet delivered in first trimester 12/04/2014  . Morbid obesity ([place]) 12/04/2014  . Chest pain 11/19/2010    Past Surgical History:  Procedure Laterality Date  . CESAREAN SECTION  2010   x 1    Family History  Problem Relation Age of Onset  . Hypertension Father   . Heart attack Father   . Cancer Father        lung, prostate  . Diabetes Father   . Heart disease Father   . Hypertension Brother   . COPD Mother   . Stroke Neg Hx     Social History   Socioeconomic History  . Marital status: Married    Spouse name: Shanon Brow  . Number of children: 4  . Years of education: Not on file  . Highest education level: Not on file  Occupational History  . Not on file  Social Needs  . Financial resource strain: Not hard at all  . Food insecurity    Worry: Never true    Inability: Never true  . Transportation needs    Medical: No    Non-medical: No  Tobacco Use  . Smoking status: Never Smoker  . Smokeless tobacco: Never Used  Substance and Sexual Activity  . Alcohol use: Yes    Alcohol/week: 0.0 standard drinks    Comment: occasional  . Drug use: No  . Sexual activity: Yes    Partners: Male  Lifestyle  . Physical activity    Days per week: 3 days    Minutes per session: 30 min  . Stress: Only a little  Relationships  . Social connections    Talks on phone: More than three times a week    Gets together: More than three times a week    Attends religious service: Never     Active member of club or organization: No    Attends meetings of clubs or organizations: Never    Relationship status: Married  . Intimate partner violence    Fear of current or ex partner: No    Emotionally abused: No    Physically abused: No    Forced sexual activity: No  Other Topics Concern  . Not on file  Social History Narrative   Emergency Room  Current Outpatient Medications:  .  diltiazem (CARDIZEM) 30 MG tablet, Take 1 tablet (30 mg total) by mouth 3 (three) times daily as needed. (Patient not taking: Reported on 11/07/2018), Disp: 60 tablet, Rfl: 2  No Known Allergies   ROS  Constitutional: Negative for fever or weight change.  Respiratory: Negative for cough and shortness of breath.   Cardiovascular: Negative for chest pain.  Gastrointestinal: Negative for abdominal pain, no bowel changes.  Musculoskeletal: Negative for gait problem or joint swelling.  Skin: Negative for rash.  Neurological: Negative for dizziness or headache.  No other specific complaints in a complete review of systems (except as listed in HPI above)  Objective  Vitals:   11/07/18 1351  BP: 122/80  Pulse: 89  Resp: 16  Temp: 97.6 F (36.4 C)  TempSrc: Oral  SpO2: 97%  Weight: 251 lb 14.4 oz (114.3 kg)  Height: _0  (1.727 m)    Body mass index is 38.3 kg/m.  Physical Exam  Constitutional: Patient appears well-developed and well-nourished. No distress.  HENT: Head: Normocephalic and atraumatic. Ears: B TMs ok, no erythema or effusion; Nose: Nose normal. Mouth/Throat: Oropharynx is clear and moist. No oropharyngeal exudate.  Eyes: Conjunctivae and EOM are normal. Pupils are equal, round, and reactive to light. No scleral icterus.  Neck: Normal range of motion. Neck supple. No JVD present. No thyromegaly present.  Cardiovascular: Normal rate, regular rhythm and normal heart sounds.  No murmur heard. No BLE edema. Pulmonary/Chest: Effort normal and breath sounds normal. No  respiratory distress. Abdominal: Soft. Bowel sounds are normal, no distension. There is no tenderness. no masses Breast: no lumps or masses, no nipple discharge or rashes FEMALE GENITALIA: Deferred Musculoskeletal: Normal range of motion, no joint effusions. No gross deformities Neurological: he is alert and oriented to person, place, and time. No cranial nerve deficit. Coordination, balance, strength, speech and gait are normal.  Skin: Skin is warm and dry. No rash noted. No erythema.  Psychiatric: Patient has a normal mood and affect. behavior is normal. Judgment and thought content normal.  No results found for this or any previous visit (from the past 2160 hour(s)).  PHQ2/9: Depression screen [facility] 2/9 11/07/2018 07/08/2015  Decreased Interest 0 0  Down, Depressed, Hopeless 0 0  PHQ - 2 Score 0 0  Altered sleeping 0 -  Tired, decreased energy 0 -  Change in appetite 0 -  Feeling bad or failure about yourself  0 -  Trouble concentrating 0 -  Moving slowly or fidgety/restless 0 -  Suicidal thoughts 0 -  PHQ-9 Score 0 -  Difficult doing work/chores Not difficult at all -   Fall Risk: Fall Risk  11/07/2018 07/08/2015  Falls in the past year? 0 No  Number falls in past yr: 0 -  Injury with Fall? 0 -  Follow up Falls evaluation completed -   Assessment & Plan  1. Well woman exam (no gynecological exam) -USPSTF grade A and B recommendations reviewed with patient; age-appropriate recommendations, preventive care, screening tests, etc discussed and encouraged; healthy living encouraged; see AVS for patient education given to patient -Discussed importance of 150 minutes of physical activity weekly, eat two servings of fish weekly, eat one serving of tree nuts ( cashews, pistachios, pecans, almonds.Marland Kitchen) every other day, eat 6 servings of fruit/vegetables daily and drink plenty of water and avoid sweet beverages.   2. Bilateral lower extremity edema - Advised compression stockings; discuss  with Dr. Rockey Situ at next follow up; DASH diet.  3. Morbid obesity ([place]) - See teaching above - COMPLETE METABOLIC PANEL WITH GFR - Lipid panel  4. Breast cancer screening by mammogram - MM 3D SCREEN BREAST BILATERAL; Future  5. Need for hepatitis C screening test - Hepatitis C antibody  6. Diabetes mellitus screening - COMPLETE METABOLIC PANEL WITH GFR  -USPSTF grade A and B recommendations reviewed with patient; age-appropriate recommendations, preventive care, screening tests, etc discussed and encouraged; healthy living encouraged; see AVS for patient education given to patient -Discussed importance of 150 minutes of physical activity weekly, eat two servings of fish weekly, eat one serving of tree nuts ( cashews, pistachios, pecans, almonds.Marland Kitchen) every other day, eat 6 servings of fruit/vegetables daily and drink plenty of water and avoid sweet beverages.

## 2018-11-07 NOTE — Patient Instructions (Signed)

## 2018-11-08 LAB — LIPID PANEL
Cholesterol: 169 mg/dL (ref <200)
HDL: 53 mg/dL (ref >=50)
LDL Cholesterol (Calc): 93 mg/dL (calc)
Non-HDL Cholesterol (Calc): 116 mg/dL (calc) (ref <130)
Total CHOL/HDL Ratio: 3.2 (calc) (ref <5.0)
Triglycerides: 126 mg/dL (ref <150)

## 2018-11-08 LAB — COMPLETE METABOLIC PANEL WITH GFR
AG Ratio: 1.7 (calc) (ref 1.0–2.5)
ALT: 18 U/L (ref 6–29)
AST: 17 U/L (ref 10–30)
Albumin: 4.3 g/dL (ref 3.6–5.1)
Alkaline phosphatase (APISO): 63 U/L (ref 31–125)
BUN: 8 mg/dL (ref 7–25)
CO2: 26 mmol/L (ref 20–32)
Calcium: 9.4 mg/dL (ref 8.6–10.2)
Chloride: 105 mmol/L (ref 98–110)
Creat: 0.66 mg/dL (ref 0.50–1.10)
GFR, Est African American: 131 mL/min/{1.73_m2} (ref >=60)
GFR, Est Non African American: 113 mL/min/{1.73_m2} (ref >=60)
Globulin: 2.6 g/dL (calc) (ref 1.9–3.7)
Glucose, Bld: 80 mg/dL (ref 65–99)
Potassium: 4 mmol/L (ref 3.5–5.3)
Sodium: 140 mmol/L (ref 135–146)
Total Bilirubin: 0.4 mg/dL (ref 0.2–1.2)
Total Protein: 6.9 g/dL (ref 6.1–8.1)

## 2018-11-08 LAB — HEPATITIS C ANTIBODY
Hepatitis C Ab: NONREACTIVE
SIGNAL TO CUT-OFF: 0.01 (ref <1.00)

## 2019-06-17 DIAGNOSIS — Z20828 Contact with and (suspected) exposure to other viral communicable diseases: Secondary | ICD-10-CM | POA: Diagnosis not present

## 2019-06-25 ENCOUNTER — Encounter: Payer: Self-pay | Admitting: Family Medicine

## 2019-06-26 ENCOUNTER — Ambulatory Visit (INDEPENDENT_AMBULATORY_CARE_PROVIDER_SITE_OTHER): Payer: 59 | Admitting: Internal Medicine

## 2019-06-26 ENCOUNTER — Encounter: Payer: Self-pay | Admitting: Internal Medicine

## 2019-06-26 VITALS — HR 108 | Temp 98.0°F | Ht 66.0 in | Wt 245.0 lb

## 2019-06-26 DIAGNOSIS — Z8616 Personal history of COVID-19: Secondary | ICD-10-CM | POA: Diagnosis not present

## 2019-06-26 DIAGNOSIS — R05 Cough: Secondary | ICD-10-CM

## 2019-06-26 DIAGNOSIS — J00 Acute nasopharyngitis [common cold]: Secondary | ICD-10-CM

## 2019-06-26 DIAGNOSIS — R059 Cough, unspecified: Secondary | ICD-10-CM

## 2019-06-26 NOTE — Progress Notes (Signed)
Name: Leslie Chapman   MRN: [id number]    DOB: 19-Apr-1980   Date:06/26/2019       Progress Note  Subjective  Chief Complaint  Chief Complaint  Patient presents with  . Nasal Congestion    patient works in [employer], 12 hour shifts, goes back to work Friday,   . Cough    feels it is improving, will have coughing fits that last 1-2 minutes    I connected with  Daylene Katayama  on 06/26/19 at 10:00 AM EDT by a video enabled telemedicine application and verified that I am speaking with the correct person using two identifiers.  I discussed the limitations of evaluation and management by telemedicine and the availability of in person appointments. The patient expressed understanding and agreed to proceed. Staff also discussed with the patient that there may be a patient responsible charge related to this service. Patient Location: Home Provider Location: [facility] Additional Individuals present: none  HPI Patient is a 39 year old female who follows up today.  Her last visit at [facility] was with Raquel Sarna in August for a well woman exam.  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  Her test the Sunday after came back positive for Covid.  She did lose her smell and taste, only for couple days, and then it returned.  She had a couple days with her sats in the 80s, and was concerned she may need to be seen in an emergency setting, although they have been improved, and all in all, she is feeling a lot better. She is scheduled to return to work on Friday, and does 12-hour shifts where she has to wear a mask, and has some concern with persistent nasal congestion, and tolerating wearing the mask.  She notes her cough is much improved, although still has some intermittent episodes.  The production has significantly decreased.  She no longer has fevers. All in all, she notes she is feeling better, and wanted to have a plan in place with her scheduled to return to  work Friday, especially if symptoms do not further improve or possibly again worsen.  She is quite confident they will continue to improve. Tobacco-never smoker  Comorbid conditions reviewed  No h/o asthma/COPD No h/o DM, heart disease, CKD,  + obesity     Patient Active Problem List   Diagnosis Date Noted  . Breast skin changes 07/08/2015  . 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 Tachycardia 12/04/2014  . Preventative health care 12/04/2014  . Bilateral leg edema 12/04/2014  . History of anemia 12/04/2014  . Atypical chest pain 12/04/2014  . Pregnant and not yet delivered in first trimester 12/04/2014  . Morbid obesity ([place]) 12/04/2014  . Chest pain 11/19/2010    Past Surgical History:  Procedure Laterality Date  . CESAREAN SECTION  2010   x 1    Family History  Problem Relation Age of Onset  . Hypertension Father   . Heart attack Father   . Cancer Father        lung, prostate  . Diabetes Father   . Heart disease Father   . Hypertension Brother   . COPD Mother   . Breast cancer Maternal Grandmother   . Stroke Neg Hx     Social History   Tobacco Use  . Smoking status: Never Smoker  . Smokeless tobacco: Never Used  Substance Use Topics  . Alcohol  use: Yes    Alcohol/week: 0.0 standard drinks    Comment: occasional     Current Outpatient Medications:  .  diltiazem (CARDIZEM) 30 MG tablet, Take 1 tablet (30 mg total) by mouth 3 (three) times daily as needed., Disp: 60 tablet, Rfl: 2  No Known Allergies  With staff assistance, above reviewed with the patient today.   ROS: As per HPI, otherwise no specific complaints on a limited and focused system review   Objective  Virtual encounter, vitals not obtained.  Body mass index is 39.54 kg/m.  Physical Exam  Patient appears in NAD, very pleasant Pulmonary/Chest: Effort normal. No respiratory distress. Speaking in complete sentences Neurological: Pt is alert,  Speech is  normal.  Psychiatric: Patient has a normal mood and affect, behavior is normal. Very appropriate with conversation, judgment and thought content normal.   No results found for this or any previous visit (from the past 72 hour(s)).  PHQ2/9: Depression screen [facility] 2/9 06/26/2019 11/07/2018 07/08/2015  Decreased Interest 0 0 0  Down, Depressed, Hopeless 0 0 0  PHQ - 2 Score 0 0 0  Altered sleeping 0 0 -  Tired, decreased energy 2 0 -  Change in appetite 0 0 -  Feeling bad or failure about yourself  0 0 -  Trouble concentrating 0 0 -  Moving slowly or fidgety/restless 0 0 -  Suicidal thoughts 0 0 -  PHQ-9 Score 2 0 -  Difficult doing work/chores Not difficult at all Not difficult at all -   PHQ-2/9 Result reviewed   Fall Risk: Fall Risk  06/26/2019 11/07/2018 07/08/2015  Falls in the past year? 0 0 No  Number falls in past yr: 0 0 -  Injury with Fall? 0 0 -  Follow up - Falls evaluation completed -     Assessment & Plan 1. History of COVID-19 Patient with the recent Covid infection as noted above, and all in all has improved.  She works in an emergency room setting, and is scheduled to return to work Friday.  Do feel is reasonable as long as her symptoms continue to improve and not worsen again. He is very aware of the Covid symptoms to monitor, and the importance of following up again if she is not continuing to improve or again worsening.  2. Acute rhinitis Recommended a Flonase product (Fluticasone generic) - 1 spray to each nostril daily (can use bid first three days to help prime the pump) to help with her rhinitis symptoms presently. She noted she does not usually get these types of symptoms with change of seasons.  Do feel is likely in association with the Covid infection.  3. Cough Significantly improved, I do not feel would add anything like steroids presently, either inhaled or systemically as discussed the risk/benefit of medications that is entertained, and not sure the  benefit greatly outweighs the risk here.  Encouraged she is improved and agree with her in that I would expect her continued improvement as the week progresses, and she was very much in agreement with not adding more medications presently to help with her cough.  We will follow-up again if not continuing to improve or more problematic.    I discussed the assessment and treatment plan with the patient. The patient was provided an opportunity to ask questions and all were answered. The patient agreed with the plan and demonstrated an understanding of the instructions.  The patient was advised to call back or seek an in-person evaluation if  the symptoms worsen or if the condition fails to improve as anticipated.  I provided 15 minutes of non-face-to-face time during this encounter that included discussing at length patient's sx/history, pertinent pmhx, medications, treatment and follow up plan. This time also included the necessary documentation, orders, and chart review.

## 2019-06-28 ENCOUNTER — Encounter: Payer: Self-pay | Admitting: Internal Medicine

## 2019-07-19 ENCOUNTER — Other Ambulatory Visit: Payer: Self-pay

## 2019-07-19 ENCOUNTER — Encounter: Payer: Self-pay | Admitting: Family Medicine

## 2019-07-19 ENCOUNTER — Ambulatory Visit: Payer: 59 | Admitting: Family Medicine

## 2019-07-19 ENCOUNTER — Other Ambulatory Visit: Payer: Self-pay | Admitting: Family Medicine

## 2019-07-19 VITALS — BP 118/82 | HR 96 | Temp 98.3°F | Resp 14 | Ht 66.0 in | Wt 254.9 lb

## 2019-07-19 DIAGNOSIS — Z803 Family history of malignant neoplasm of breast: Secondary | ICD-10-CM

## 2019-07-19 DIAGNOSIS — R234 Changes in skin texture: Secondary | ICD-10-CM | POA: Diagnosis not present

## 2019-07-19 DIAGNOSIS — Z1231 Encounter for screening mammogram for malignant neoplasm of breast: Secondary | ICD-10-CM

## 2019-07-19 MED ORDER — KETOCONAZOLE 2 % EX CREA: 1.0000 "application " | TOPICAL_CREAM | Freq: Every day | CUTANEOUS | 0 refills | Status: DC | PRN

## 2019-07-19 MED ORDER — TRIAMCINOLONE ACETONIDE 0.1 % EX CREA
1.0000 | TOPICAL_CREAM | Freq: Every day | CUTANEOUS | 0 refills | Status: DC | PRN
Start: 2019-07-19 — End: 2019-07-19

## 2019-07-19 NOTE — Progress Notes (Signed)
Patient ID: Leslie Chapman, female    DOB: 08/26/1980, 39 y.o.   MRN: [id number]  PCP: Delsa Grana, PA-C  Chief Complaint  Patient presents with  . Breast Problem    right nipple dryness, with itching onset 3-4 months    Subjective:   Leslie Chapman is a 39 y.o. female, presents to clinic with CC of the following:  HPI   A few weeks ago right breast started to have right on and above nipple raised red burning rash/patch?  Small amount is been is slightly dry sometimes raised or red appearing has had itching and burning sensation, began at least a couple months ago but recently has worsened.  No trauma or injury to the area no other similar rash to her body or on her significant other.  She has tried applying over-the-counter creams and ointments without any improvement.  She denies any nipple discharge, bleeding.  A few years ago had abnormal sx with b/l breasts saw Dr. Sanda Klein  Breast skin changes 07/08/2015   Purplish blue appeared similar to bruising to bilateral breast - dermatology in [place] in [place]   Family history in maternal grandmother breast CA     Patient Active Problem List   Diagnosis Date Noted  . History of COVID-19 06/26/2019  . Breast skin changes 07/08/2015  . 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 Tachycardia 12/04/2014  . Preventative health care 12/04/2014  . Bilateral leg edema 12/04/2014  . History of anemia 12/04/2014  . Atypical chest pain 12/04/2014  . Pregnant and not yet delivered in first trimester 12/04/2014  . Morbid obesity ([place]) 12/04/2014  . Chest pain 11/19/2010      Current Outpatient Medications:  .  diltiazem (CARDIZEM) 30 MG tablet, Take 1 tablet (30 mg total) by mouth 3 (three) times daily as needed., Disp: 60 tablet, Rfl: 2   No Known Allergies   Family History  Problem Relation Age of Onset  . Hypertension Father   . Heart attack Father   . Cancer Father        lung, prostate  .  Diabetes Father   . Heart disease Father   . Hypertension Brother   . COPD Mother   . Breast cancer Maternal Grandmother   . Stroke Neg Hx      Social History   Socioeconomic History  . Marital status: Married    Spouse name: Shanon Brow  . Number of children: 4  . Years of education: Not on file  . Highest education level: Not on file  Occupational History  . Not on file  Tobacco Use  . Smoking status: Never Smoker  . Smokeless tobacco: Never Used  Substance and Sexual Activity  . Alcohol use: Yes    Alcohol/week: 0.0 standard drinks    Comment: occasional  . Drug use: No  . Sexual activity: Yes    Partners: Male  Other Topics Concern  . Not on file  Social History Narrative   Emergency Room   Social Determinants of Health   Financial Resource Strain: Low Risk   . Difficulty of Paying Living Expenses: Not hard at all  Food Insecurity: No Food Insecurity  . Worried About Charity fundraiser in the Last Year: Never true  . Ran Out of Food in the Last Year: Never true  Transportation Needs: No Transportation Needs  . Lack of Transportation (Medical): No  . Lack of Transportation (Non-Medical): No  Physical Activity: Insufficiently  Active  . Days of Exercise per Week: 3 days  . Minutes of Exercise per Session: 30 min  Stress: No Stress Concern Present  . Feeling of Stress : Only a little  Social Connections: Somewhat Isolated  . Frequency of Communication with Friends and Family: More than three times a week  . Frequency of Social Gatherings with Friends and Family: More than three times a week  . Attends Religious Services: Never  . Active Member of Clubs or Organizations: No  . Attends Archivist Meetings: Never  . Marital Status: Married  Human resources officer Violence: Not At Risk  . Fear of Current or Ex-Partner: No  . Emotionally Abused: No  . Physically Abused: No  . Sexually Abused: No    Chart Review Today: I personally reviewed active problem  list, medication list, allergies, family history, social history, health maintenance, notes from last encounter, lab results, imaging with the patient/caregiver today.    Review of Systems 10 Systems reviewed and are negative for acute change except as noted in the HPI.     Objective:   Vitals:   07/19/19 0832  BP: 118/82  Pulse: 96  Resp: 14  Temp: 98.3 F (36.8 C)  SpO2: 98%  Weight: 254 lb 14.4 oz (115.6 kg)  Height: 5\' 6"  (1.676 m)    Body mass index is 41.14 kg/m.  Physical Exam Vitals and nursing note reviewed.  Constitutional:      General: She is not in acute distress.    Appearance: Normal appearance. She is well-developed. She is obese. She is not ill-appearing, toxic-appearing or diaphoretic.  HENT:     Head: Normocephalic and atraumatic.  Eyes:     General:        Right eye: No discharge.        Left eye: No discharge.     Conjunctiva/sclera: Conjunctivae normal.  Neck:     Trachea: No tracheal deviation.  Cardiovascular:     Rate and Rhythm: Normal rate.  Pulmonary:     Effort: Pulmonary effort is normal. No respiratory distress.     Breath sounds: No stridor.  Chest:     Breasts:        Right: Normal.   Skin:    Coloration: Skin is not jaundiced or pale.     Findings: Rash present.     Comments: Small oval raised area to right nipple superior aspect, mildly erythematous and dry appearing, no central clearing or raised edges.  No other rash noted to breasts or chest  Neurological:     Mental Status: She is alert.  Psychiatric:        Mood and Affect: Mood normal.        Behavior: Behavior normal.      Results for orders placed or performed in visit on 11/07/18  Hepatitis C antibody  Result Value Ref Range   Hepatitis C Ab NON-REACTIVE NON-REACTI   SIGNAL TO CUT-OFF 0.01 <1.00  COMPLETE METABOLIC PANEL WITH GFR  Result Value Ref Range   Glucose, Bld 80 65 - 99 mg/dL   BUN 8 7 - 25 mg/dL   Creat 0.66 0.50 - 1.10 mg/dL   GFR, Est Non  African American 113 > OR = 60 mL/min/1.1m2   GFR, Est African American 131 > OR = 60 mL/min/1.30m2   BUN/Creatinine Ratio NOT APPLICABLE 6 - 22 (calc)   Sodium 140 135 - 146 mmol/L   Potassium 4.0 3.5 - 5.3 mmol/L   Chloride 105  98 - 110 mmol/L   CO2 26 20 - 32 mmol/L   Calcium 9.4 8.6 - 10.2 mg/dL   Total Protein 6.9 6.1 - 8.1 g/dL   Albumin 4.3 3.6 - 5.1 g/dL   Globulin 2.6 1.9 - 3.7 g/dL (calc)   AG Ratio 1.7 1.0 - 2.5 (calc)   Total Bilirubin 0.4 0.2 - 1.2 mg/dL   Alkaline phosphatase (APISO) 63 31 - 125 U/L   AST 17 10 - 30 U/L   ALT 18 6 - 29 U/L  Lipid panel  Result Value Ref Range   Cholesterol 169 <200 mg/dL   HDL 53 > OR = 50 mg/dL   Triglycerides 126 <150 mg/dL   LDL Cholesterol (Calc) 93 mg/dL (calc)   Total CHOL/HDL Ratio 3.2 <5.0 (calc)   Non-HDL Cholesterol (Calc) 116 <130 mg/dL (calc)        Assessment & Plan:      ICD-10-CM   1. Breast skin changes  R23.4 MM 3D SCREEN BREAST BILATERAL    triamcinolone cream (KENALOG) 0.1 %    ketoconazole (NIZORAL) 2 % cream  2. Encounter for screening mammogram for malignant neoplasm of breast  Z12.31 MM 3D SCREEN BREAST BILATERAL  3. Family history of breast cancer in female  Z34.3 MM 3D [place] for #1 seems more consistent with some mild skin irritation or rash to the area, does not seem consistent with ringworm no obvious associated trauma or past scarring, encouraged her to continue to use Vaseline to moisturize the skin, try very sparingly triamcinolone ointment with topical ketoconazole cream topically twice daily for 1 week follow-up if not improving.  Patient does wish to have mammogram done because of prior breast changes and skin color changes to the area no known breast mass lumps or nodules noted,.  No other reported changes to skin or nipples    Delsa Grana, PA-C 07/19/19 8:50 AM

## 2019-07-19 NOTE — Patient Instructions (Addendum)
[facility] at [facility],  [place]  [address] Get Driving Directions Main: [phone]   Do a topical steroid to the area once a day - apply with the steroid an antifungal/antiyeast   Do aquaphor or vaseline to the affected skin area 1-2 x a day and always after bathing  Follow up here if not improving in 2-4 weeks.

## 2019-07-24 ENCOUNTER — Ambulatory Visit
Admission: RE | Admit: 2019-07-24 | Discharge: 2019-07-24 | Disposition: A | Discharge status: Home / Self Care | Payer: 59 | Source: Ambulatory Visit | Attending: Family Medicine | Admitting: Family Medicine

## 2019-07-24 DIAGNOSIS — R234 Changes in skin texture: Secondary | ICD-10-CM | POA: Insufficient documentation

## 2019-07-24 DIAGNOSIS — Z803 Family history of malignant neoplasm of breast: Secondary | ICD-10-CM | POA: Diagnosis not present

## 2019-07-24 DIAGNOSIS — Z1231 Encounter for screening mammogram for malignant neoplasm of breast: Secondary | ICD-10-CM | POA: Insufficient documentation

## 2019-07-25 ENCOUNTER — Other Ambulatory Visit: Payer: Self-pay | Admitting: Family Medicine

## 2019-07-25 ENCOUNTER — Encounter: Payer: Self-pay | Admitting: Family Medicine

## 2019-07-25 DIAGNOSIS — R928 Other abnormal and inconclusive findings on diagnostic imaging of breast: Secondary | ICD-10-CM

## 2019-07-25 DIAGNOSIS — N6489 Other specified disorders of breast: Secondary | ICD-10-CM

## 2019-07-31 ENCOUNTER — Ambulatory Visit
Admission: RE | Admit: 2019-07-31 | Discharge: 2019-07-31 | Disposition: A | Discharge status: Home / Self Care | Payer: 59 | Source: Ambulatory Visit | Attending: Family Medicine | Admitting: Family Medicine

## 2019-07-31 DIAGNOSIS — R928 Other abnormal and inconclusive findings on diagnostic imaging of breast: Secondary | ICD-10-CM | POA: Diagnosis not present

## 2019-07-31 DIAGNOSIS — N6489 Other specified disorders of breast: Secondary | ICD-10-CM

## 2019-08-02 ENCOUNTER — Other Ambulatory Visit: Payer: 59

## 2019-08-02 DIAGNOSIS — R234 Changes in skin texture: Secondary | ICD-10-CM | POA: Diagnosis not present

## 2019-08-03 ENCOUNTER — Other Ambulatory Visit: Payer: 59

## 2019-08-03 ENCOUNTER — Ambulatory Visit: Payer: 59

## 2019-08-10 ENCOUNTER — Encounter: Payer: Self-pay | Admitting: Family Medicine

## 2019-08-16 ENCOUNTER — Ambulatory Visit (INDEPENDENT_AMBULATORY_CARE_PROVIDER_SITE_OTHER): Payer: 59

## 2019-08-16 ENCOUNTER — Other Ambulatory Visit: Payer: Self-pay

## 2019-08-16 ENCOUNTER — Ambulatory Visit (INDEPENDENT_AMBULATORY_CARE_PROVIDER_SITE_OTHER): Payer: 59 | Admitting: Family

## 2019-08-16 ENCOUNTER — Encounter: Payer: Self-pay | Admitting: Family

## 2019-08-16 VITALS — BP 140/82 | HR 95 | Ht 66.0 in | Wt 255.0 lb

## 2019-08-16 DIAGNOSIS — I471 Supraventricular tachycardia: Secondary | ICD-10-CM

## 2019-08-16 DIAGNOSIS — I495 Sick sinus syndrome: Secondary | ICD-10-CM

## 2019-08-16 DIAGNOSIS — R600 Localized edema: Secondary | ICD-10-CM | POA: Diagnosis not present

## 2019-08-16 DIAGNOSIS — R030 Elevated blood-pressure reading, without diagnosis of hypertension: Secondary | ICD-10-CM

## 2019-08-16 NOTE — Patient Instructions (Addendum)
Medication Instructions:  No medication changes today.   *If you need a refill on your cardiac medications before your next appointment, please call your pharmacy*   Lab Work: Your physician recommends that you return for lab work today: BMET, magnesium, TSH, CBC  If you have labs (blood work) drawn today and your tests are completely normal, you will receive your results only by: Marland Kitchen MyChart Message (if you have MyChart) OR . A paper copy in the mail If you have any lab test that is abnormal or we need to change your treatment, we will call you to review the results.  Testing/Procedures: Your EKG today shows normal sinus rhythm.   Please wear your ZIO monitor for 14 days.  Follow-Up: At [facility], you and your health needs are our priority.  As part of our continuing mission to provide you with exceptional heart care, we have created designated Provider Care Teams.  These Care Teams include your primary Cardiologist (physician) and Advanced Practice Providers (APPs -  Physician Assistants and Nurse Practitioners) who all work together to provide you with the care you need, when you need it.  We recommend signing up for the patient portal called "MyChart".  Sign up information is provided on this After Visit Summary.  MyChart is used to connect with patients for Virtual Visits (Telemedicine).  Patients are able to view lab/test results, encounter notes, upcoming appointments, etc.  Non-urgent messages can be sent to your provider as well.   To learn more about what you can do with MyChart, go to [email or web address].    Your next appointment:   With Dr. Caryl Comes - he is an electrophysiologist  Other Instructions   Recommend purchasing BP cuff and checking twice per week. Our blood pressure goal is less than 130/80.  Try low salt diet and elevating your legs when sitting to help with your swelling.   Supraventricular Tachycardia, Adult Supraventricular tachycardia (SVT) is a  kind of abnormal heartbeat. It makes your heart beat very fast and then beat at a normal speed. A normal resting heartbeat is 60-100 times a minute. This condition can make your heart beat more than 150 times a minute. Times of having a fast heartbeat (episodes) can be scary, but they are usually not dangerous. In some cases, they may lead to heart failure if:  They happen many times per day.  Last longer than a few seconds. What are the causes?  A normal heartbeat starts when an area called the sinoatrial node sends out an electrical signal. In SVT, other areas of the heart send out signals that get in the way of the signal from the sinoatrial node. What increases the risk? You are more likely to develop this condition if you are:  14-60 years old.  A woman. The following factors may make you more likely to develop this condition:  Stress.  Tiredness.  Smoking.  Stimulant drugs, such as cocaine and methamphetamine.  Alcohol.  Caffeine.  Pregnancy.  Feeling worried or nervous (anxiety). What are the signs or symptoms?  A pounding heart.  A feeling that your heart is skipping beats (palpitations).  Weakness.  Trouble getting enough air.  Pain or tightness in your chest.  Feeling like you are going to pass out (faint).  Feeling worried or nervous.  Dizziness.  Sweating.  Feeling sick to your stomach (nausea).  Passing out.  Tiredness. Sometimes, there are no symptoms. How is this treated?  Vagal nerve stimulation. Ways to do this include: ?  Holding your breath and pushing, as though you are pooping (having a bowel movement). ? Massaging an area on one side of your neck. Do not try this yourself. Only a doctor should do this. If done the wrong way, it can lead to a stroke. ? Bending forward with your head between your legs. ? Coughing while bending forward with your head between your legs. ? Closing your eyes and massaging your eyeballs. Ask a doctor how  to do this.  Medicines that prevent attacks.  Medicine to stop an attack given through an IV tube at the hospital.  A small electric shock (cardioversion) that stops an attack.  Radiofrequency ablation. In this procedure, a small, thin tube (catheter) is used to send energy to the area that is causing the rapid heartbeats. If you do not have symptoms, you may not need treatment. Follow these instructions at home: Stress  Avoid things that make you feel stressed.  To deal with stress, try: ? Doing yoga or meditation, or being out in nature. ? Listening to relaxing music. ? Doing deep breathing. ? Taking steps to be healthy, such as getting lots of sleep, exercising, and eating a balanced diet. ? Talking with a mental health doctor. Lifestyle   Try to get at least 7 hours of sleep each night.  Do not use any products that contain nicotine or tobacco, such as cigarettes, e-cigarettes, and chewing tobacco. If you need help quitting, ask your doctor.  Be aware of how alcohol affects you. ? If alcohol gives you a fast heartbeat, do not drink alcohol. ? If alcohol does not seem to give you a fast heartbeat, limit alcohol use to no more than 1 drink a day for women who are not pregnant, and 2 drinks a day for men. In the [place] one drink is one of these:  12 oz of beer (355 mL).  5 oz of wine (148 mL).  1 oz of hard liquor (44 mL).  Be aware of how caffeine affects you. ? If caffeine gives you a fast heartbeat, do not eat, drink, or use anything with caffeine in it. ? If caffeine does not seem to give you a fast heartbeat, limit how much caffeine you eat, drink, or use.  Do not use stimulant drugs. If you need help quitting, ask your doctor. General instructions  Stay at a healthy weight.  Exercise regularly. Ask your doctor about good activities for you. Try one or a mixture of these: ? 150 minutes a week of gentle exercise, like walking or yoga. ? 75 minutes a week of  exercise that is very active, like running or swimming.  Do vagus nerve treatments to slow down your heartbeat as told by your doctor.  Take over-the-counter and prescription medicines only as told by your doctor.  Keep all follow-up visits as told by your doctor. This is important. Contact a doctor if:  You have a fast heartbeat more often.  Times of having a fast heartbeat last longer than before.  Home treatments to slow down your heartbeat do not help.  You have new symptoms. Get help right away if:  You have chest pain.  Your symptoms get worse.  You have trouble breathing.  Your heart beats very fast for more than 20 minutes.  You pass out. These symptoms may be an emergency. Do not wait to see if the symptoms will go away. Get medical help right away. Call your local emergency services (911 in the [place] Do  not drive yourself to the hospital. Summary  SVT is a type of abnormal heart beat.  This condition can make your heart beat more than 150 times a minute.  Treatment depends on how often the condition happens and your symptoms. This information is not intended to replace advice given to you by your health care provider. Make sure you discuss any questions you have with your health care provider. Document Revised: 01/31/2018 Document Reviewed: 01/31/2018 Elsevier Patient Education  2020 [employer].

## 2019-08-16 NOTE — Progress Notes (Signed)
Office Visit    Patient Name: Leslie Chapman Date of Encounter: 08/16/2019  Primary Care Provider:  Delsa Grana, PA-C Primary Cardiologist:  Ida Rogue, MD Electrophysiologist:  None   Chief Complaint    Leslie Chapman is a 39 y.o. female with a hx of SVT presents today for tachycardia.   Past Medical History    Past Medical History:  Diagnosis Date  . SVT (supraventricular tachycardia) (HCC)    Past Surgical History:  Procedure Laterality Date  . CESAREAN SECTION  2010   x 1    Allergies  No Known Allergies  History of Present Illness    Leslie Chapman is a 39 y.o. female with a hx of SVT, tachycardia last seen 10/02/2018 by Dr. Rockey Situ.  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  Previous cardiac workup for chest discomfort includes echocardiogram 11/2014 with LVEF 60 to 65%, no wall motion abnormalities, diastolic parameters normal, no significant valvular normalities. Holter monitor 2016 with NSR, few PACs, and sinus tachycardia with average heart rate of 88bpm.   Sent MyChart message yesterday noting abnormal heart rhythm the night prior. When laying down in bed 'felt my heart change rhythm' with Apple Watch HR 196-197 which converted after 3 vagal maneuvers. 1 hour later woke up with rapid rates and got Kardia reading.   She had her initial episode of tachycardia a number of years ago.  It recurred 6 to 12 months later.  Associated with hard pounding heartbeat and significant fatigue afterwards.  Tells me after it occurred a third time she discussed with Dr. Rockey Situ.  Previous cardiac work-up as above.  Tells me she has never taken her diltiazem 30 mg as needed as it always resolves before she would need to take.  Tells me she gets a better night her heart rate go up to the 120s.  She does wear an apple watch routinely.  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  Her children all have respiratory issues that she  also has a pulse oximeter at home.  Most recent episode tells me during the daytime she was having an occasional "thump" sensation in her chest.  Tells me that she went to bed at night and noticed that her heart rate was very fast.  One of her children being her the pulse ox and on her heart rate was 190 bpm.  She put on apple watch and did EKG which is available for review which is suggestive of SVT 197 bpm.  She performed vagal maneuver 3 times with resolution.  She went to bed and woke up an hour later with pounding sensation and tachycardia.  She used her 6-lead Kardia device which on review is suspicious for SVT.  At the end of the 30 stress second strip there is a noted irregular heart beat which could indicate junctional conversion beats but atrial fibrillation cannot be excluded.    No known family history of arrhythmia but tells me she is not very close with her family to no extended history.  Caffeine: 1 cup of coffee in the morning OTC medications: None regularly Stress: Endorses no recent increased stress.  Does endorse swelling in her hands and her feet.  Swelling in her feet is worse at the end of the day.  Does not restrict salt.  EKGs/Labs/Other Studies Reviewed:   The following studies were reviewed today:  Echo 11/2014 Study Conclusions   - Left  ventricle: The cavity size was normal. Wall thickness was    normal. Systolic function was normal. The estimated ejection    fraction was in the range of 60% to 65%. Wall motion was normal;    there were no regional wall motion abnormalities. Left    ventricular diastolic function parameters were normal.  - Aortic valve: Valve area (Vmax): 2.17 cm^2.   Holter 11/2014 Normal sinus rhythm. Few PACs. Sinus tachycardia with a normal average heart rate of 88 bpm. Overall, this is a normal Holter monitor.  EKG:  EKG is ordered today.  The ekg ordered today demonstrates NSR at 95 bpm with low voltage QRS likely due to body habitus and  no acute ST/T wave changes.  Recent Labs: 11/07/2018: ALT 18; BUN 8; Creat 0.66; Potassium 4.0; Sodium 140  Recent Lipid Panel    Component Value Date/Time   CHOL 169 11/07/2018 1444   CHOL 167 12/04/2014 1543   TRIG 126 11/07/2018 1444   HDL 53 11/07/2018 1444   HDL 58 12/04/2014 1543   CHOLHDL 3.2 11/07/2018 1444   LDLCALC 93 11/07/2018 1444    Home Medications   Current Meds  Medication Sig  . diltiazem (CARDIZEM) 30 MG tablet Take 1 tablet (30 mg total) by mouth 3 (three) times daily as needed.  Marland Kitchen ketoconazole (NIZORAL) 2 % cream Apply 1 application topically daily as needed for irritation. Use when using topical steroid  . triamcinolone cream (KENALOG) 0.1 % Apply 1 application topically daily as needed. Scant amount to affected area    Review of Systems    Review of Systems  Constitution: Negative for chills, fever and malaise/fatigue.  Cardiovascular: Positive for leg swelling and palpitations. Negative for chest pain, dyspnea on exertion, near-syncope, orthopnea and syncope.  Respiratory: Negative for cough, shortness of breath and wheezing.   Gastrointestinal: Negative for nausea and vomiting.  Neurological: Negative for dizziness, light-headedness and weakness.   All other systems reviewed and are otherwise negative except as noted above.  Physical Exam    VS:  BP 140/82 (BP Location: Left Arm, Patient Position: Sitting, Cuff Size: Large)   Pulse 95   Ht '5\' 6"'  (1.676 m)   Wt 255 lb (115.7 kg)   SpO2 96%   BMI 41.16 kg/m  , BMI Body mass index is 41.16 kg/m. GEN: Well nourished, overweight, well developed, in no acute distress. HEENT: normal. Neck: Supple, no JVD, carotid bruits, or masses. Cardiac: RRR, no murmurs, rubs, or gallops. No clubbing, cyanosis. Trace bilateral pedal edema. .  Radials/PT 2+ and equal bilaterally.  Respiratory:  Respirations regular and unlabored, clear to auscultation bilaterally. GI: Soft, nontender, nondistended MS: No deformity  or atrophy. Skin: Warm and dry, no rash. Neuro:  Strength and sensation are intact. Psych: Normal affect.  Assessment & Plan    1. SVT/Tachycardia - Longstanding history of tachycardia.  Most recent episode 08/15/2019 with elevated heart rates when lying down for sleep of 197 bpm.  Captured EKG on apple watch which is suspicious for SVT.  Resolved after 3 vagal maneuvers.  Woke up an hour later with rapid heart rates and pounding sensation in her chest.  Kardia strip reviewed with likely SVT with junctional beats but atrial fibrillation cannot be excluded.  She has a CHA2DS2-VASc of 1 (female), no indication for anticoagulation at this time.  She has never used her as needed diltiazem as symptoms always resolve quickly.  No excessive caffeine use, not in distress, no proarrhythmic medications.  EKG today  shows normal sinus rhythm 95 bpm  Labs today including be met, TSH, CBC, magnesium  14-day ZIO placed today to assess for recurrent arrhythmia or asymptomatic atrial fibrillation.  Referral to EP for discussion of SVT management.  2. Lower extremity edema - Likely dependent edema and venous insufficiency.  Recommend low-sodium diet and keeping legs elevated when sitting.  No orthopnea, PND, shortness of breath suspicious of heart failure.  Echo 2016 with normal LVEF and diastolic function.  3. Elevated BP without diagnosis of HTN -elevated BP today with recent office visits with normal BP.  Encouraged to purchase blood pressure cuff for home monitoring.  BP goal less than 130/80.  If antihypertensive agent is needed would recommend consideration of HCTZ as it may help with her swelling.  Disposition: Referred to EP, Dr. Caryl Comes.   Loel Dubonnet, NP 08/16/2019, 9:38 AM

## 2019-08-17 LAB — MAGNESIUM: Magnesium: 2 mg/dL (ref 1.6–2.3)

## 2019-08-17 LAB — BASIC METABOLIC PANEL
BUN/Creatinine Ratio: 12 (ref 9–23)
BUN: 8 mg/dL (ref 6–20)
CO2: 23 mmol/L (ref 20–29)
Calcium: 9.1 mg/dL (ref 8.7–10.2)
Chloride: 104 mmol/L (ref 96–106)
Creatinine, Ser: 0.68 mg/dL (ref 0.57–1.00)
GFR calc Af Amer: 129 mL/min/{1.73_m2} (ref >59)
GFR calc non Af Amer: 112 mL/min/{1.73_m2} (ref >59)
Glucose: 90 mg/dL (ref 65–99)
Potassium: 4 mmol/L (ref 3.5–5.2)
Sodium: 141 mmol/L (ref 134–144)

## 2019-08-17 LAB — CBC
Hematocrit: 37.5 % (ref 34.0–46.6)
Hemoglobin: 12.4 g/dL (ref 11.1–15.9)
MCH: 29.7 pg (ref 26.6–33.0)
MCHC: 33.1 g/dL (ref 31.5–35.7)
MCV: 90 fL (ref 79–97)
Platelets: 213 10*3/uL (ref 150–450)
RBC: 4.17 x10E6/uL (ref 3.77–5.28)
RDW: 12.3 % (ref 11.7–15.4)
WBC: 3.7 10*3/uL (ref 3.4–10.8)

## 2019-08-17 LAB — TSH: TSH: 1.46 u[IU]/mL (ref 0.450–4.500)

## 2019-08-27 ENCOUNTER — Encounter: Payer: Self-pay | Admitting: Family

## 2019-09-13 NOTE — Progress Notes (Signed)
ELECTROPHYSIOLOGY CONSULT NOTE  Patient ID: Leslie Chapman, MRN: [id number], DOB/AGE: April 02, 1980 39 y.o. Admit date: (Not on file) Date of Consult: 09/14/2019  Primary Physician: Delsa Grana, PA-C Primary Cardiologist: Renee Pain Leslie Chapman is a 39 y.o. female who is being seen today for the evaluation of SVT  at the request of TG.    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 Primarily associated with just palpitations, and specifically denying lightheadedness shortness of breath or chest pain, these episodes have been associated with heart rates between 160 and 210, validated by [employer] on the first hand and pulse oximetry at home on the second.  No specific triggers. They are frog negative and diuretic negative. Typical duration is less than 4 minutes. She was advised by her colleagues at work to try Valsalva with these events and this is been successful in terminating her spells.  She had an event documented by her apple watch of heart rates of 180 that lasted for an hour the other night while sleeping  They are most concerning because they are frightening.  She is otherwise not fit. Morbidly obese. These events have occurred in the evening; she denies sleep apnea.  DATE TEST EF   9/16 Echo   60-65 %         Date Cr K Hgb  5/21 0.68 4.0 12.4         kARDIA STRIPS 08/27/19 reviewed  HR 162-184 distinct P waves hard to discern   Holter 2016 Personally reviewed  HR 88avg (54-143)   Past Medical History:  Diagnosis Date  . SVT (supraventricular tachycardia) [facility])       Surgical History:  Past Surgical History:  Procedure Laterality Date  . CESAREAN SECTION  2010   x 1     Home Meds: Current Meds  Medication Sig  . diltiazem (CARDIZEM) 30 MG tablet Take 1 tablet (30 mg total) by mouth 3 (three) times daily as needed.  Marland Kitchen ketoconazole  (NIZORAL) 2 % cream Apply 1 application topically daily as needed for irritation. Use when using topical steroid  . triamcinolone cream (KENALOG) 0.1 % Apply 1 application topically daily as needed. Scant amount to affected area    Allergies: No Known Allergies  Social History   Socioeconomic History  . Marital status: Married    Spouse name: Shanon Brow  . Number of children: 4  . Years of education: Not on file  . Highest education level: Not on file  Occupational History  . Not on file  Tobacco Use  . Smoking status: Never Smoker  . Smokeless tobacco: Never Used  Vaping Use  . Vaping Use: Never used  Substance and Sexual Activity  . Alcohol use: Yes    Alcohol/week: 0.0 standard drinks    Comment: occasional  . Drug use: No  . Sexual activity: Yes    Partners: Male  Other Topics Concern  . Not on file  Social History Narrative   Emergency Room   Social Determinants of Health   Financial Resource Strain: Low Risk   . Difficulty of Paying Living Expenses: Not hard at all  Food Insecurity: No Food Insecurity  . Worried About Charity fundraiser in the Last Year: Never true  . Ran Out of Food in the Last Year: Never true  Transportation Needs: No  Transportation Needs  . Lack of Transportation (Medical): No  . Lack of Transportation (Non-Medical): No  Physical Activity: Insufficiently Active  . Days of Exercise per Week: 3 days  . Minutes of Exercise per Session: 30 min  Stress: No Stress Concern Present  . Feeling of Stress : Only a little  Social Connections: Moderately Isolated  . Frequency of Communication with Friends and Family: More than three times a week  . Frequency of Social Gatherings with Friends and Family: More than three times a week  . Attends Religious Services: Never  . Active Member of Clubs or Organizations: No  . Attends Archivist Meetings: Never  . Marital Status: Married  Human resources officer Violence: Not At Risk  . Fear of Current or  Ex-Partner: No  . Emotionally Abused: No  . Physically Abused: No  . Sexually Abused: No     Family History  Problem Relation Age of Onset  . Hypertension Father   . Heart attack Father   . Cancer Father        lung, prostate  . Diabetes Father   . Heart disease Father   . Hypertension Brother   . COPD Mother   . Breast cancer Maternal Grandmother   . Stroke Neg Hx      ROS:  Please see the history of present illness.     All other systems reviewed and negative.    Physical Exam:  Blood pressure 130/84, pulse 90, height 5\' 6"  (1.676 m), weight 256 lb (116.1 kg), SpO2 98 %, unknown if currently breastfeeding. General: Well developed, well nourished female in no acute distress. Head: Normocephalic, atraumatic, sclera non-icteric, no xanthomas, nares are without discharge. EENT: normal  Lymph Nodes:  none Neck: Negative for carotid bruits. JVD not elevated. Back:without scoliosis kyphosis Lungs: Clear bilaterally to auscultation without wheezes, rales, or rhonchi. Breathing is unlabored. Heart: RRR with S1 S2. No  murmur . No rubs, or gallops appreciated. Abdomen: Soft, non-tender, non-distended with normoactive bowel sounds. No hepatomegaly. No rebound/guarding. No obvious abdominal masses. Msk:  Strength and tone appear normal for age. Extremities: No clubbing or cyanosis. tr edema.  Distal pedal pulses are 2+ and equal bilaterally. Skin: Warm and Dry Neuro: Alert and oriented X 3. CN III-XII intact Grossly normal sensory and motor function . Psych:  Responds to questions appropriately with a normal affect.      Labs: Cardiac Enzymes No results for input(s): CKTOTAL, CKMB, TROPONINI in the last 72 hours. CBC Lab Results  Component Value Date   WBC 3.7 08/16/2019   HGB 12.4 08/16/2019   HCT 37.5 08/16/2019   MCV 90 08/16/2019   PLT 213 08/16/2019   PROTIME: No results for input(s): LABPROT, INR in the last 72 hours. Chemistry No results for input(s): NA, K, CL,  CO2, BUN, CREATININE, CALCIUM, PROT, BILITOT, ALKPHOS, ALT, AST, GLUCOSE in the last 168 hours.  Invalid input(s): LABALBU Lipids Lab Results  Component Value Date   CHOL 169 11/07/2018   HDL 53 11/07/2018   LDLCALC 93 11/07/2018   TRIG 126 11/07/2018   BNP No results found for: PROBNP Thyroid Function Tests: No results for input(s): TSH, T4TOTAL, T3FREE, THYROIDAB in the last 72 hours.  Invalid input(s): FREET3 Miscellaneous No results found for: DDIMER  Radiology/Studies:  No results found.  EKG: sinus without preexcitation  Cardia strips reviewed.  Tachycardia 167-184.  No distinct retrograde P wave discernible  Assessment and Plan:  Supraventricular tachycardia probably AV reentry  Morbid obesity  Peripheral edema     Patient has recurrent tachycardia.  Relatively brief; scant symptoms.  Reviewed treatment options including ongoing intermittent self-administered vagal maneuvers, as needed versus daily calcium/beta-blockers and/or catheter ablation.  Given the paucity of symptoms and the relatively infrequent nature of these events she would like to pursue the first option.  She will continue to administer vagal maneuvers as needed and as the symptom frequency increases she is aware that catheter ablation provides a potentially curative approach  Encouraged her weight loss  Discussed the physiology of edema.  Encouraged her to decrease her fluid intake which is quite exuberant.    Virl Axe

## 2019-09-14 ENCOUNTER — Ambulatory Visit (INDEPENDENT_AMBULATORY_CARE_PROVIDER_SITE_OTHER): Payer: 59 | Admitting: Internal Medicine

## 2019-09-14 ENCOUNTER — Other Ambulatory Visit: Payer: Self-pay

## 2019-09-14 ENCOUNTER — Encounter: Payer: Self-pay | Admitting: Internal Medicine

## 2019-09-14 VITALS — BP 130/84 | HR 90 | Ht 66.0 in | Wt 256.0 lb

## 2019-09-14 DIAGNOSIS — I471 Supraventricular tachycardia: Secondary | ICD-10-CM

## 2019-09-14 NOTE — Patient Instructions (Signed)
Medication Instructions:  - Your physician recommends that you continue on your current medications as directed. Please refer to the Current Medication list given to you today.  *If you need a refill on your cardiac medications before your next appointment, please call your pharmacy*   Lab Work: - none ordered  If you have labs (blood work) drawn today and your tests are completely normal, you will receive your results only by: . MyChart Message (if you have MyChart) OR . A paper copy in the mail If you have any lab test that is abnormal or we need to change your treatment, we will call you to review the results.   Testing/Procedures: - none ordered   Follow-Up: At [facility], you and your health needs are our priority.  As part of our continuing mission to provide you with exceptional heart care, we have created designated Provider Care Teams.  These Care Teams include your primary Cardiologist (physician) and Advanced Practice Providers (APPs -  Physician Assistants and Nurse Practitioners) who all work together to provide you with the care you need, when you need it.  We recommend signing up for the patient portal called "MyChart".  Sign up information is provided on this After Visit Summary.  MyChart is used to connect with patients for Virtual Visits (Telemedicine).  Patients are able to view lab/test results, encounter notes, upcoming appointments, etc.  Non-urgent messages can be sent to your provider as well.   To learn more about what you can do with MyChart, go to [email or web address].    Your next appointment:   As needed   The format for your next appointment:   In Person  Provider:   Steven Klein, MD   Other Instructions n/a  

## 2019-10-18 ENCOUNTER — Ambulatory Visit: Payer: 59 | Admitting: Internal Medicine

## 2021-05-01 ENCOUNTER — Other Ambulatory Visit (HOSPITAL_COMMUNITY): Payer: Self-pay

## 2021-09-01 ENCOUNTER — Other Ambulatory Visit (HOSPITAL_COMMUNITY): Payer: Self-pay

## 2021-12-28 ENCOUNTER — Other Ambulatory Visit: Payer: Self-pay | Admitting: Emergency Medicine

## 2021-12-28 DIAGNOSIS — R2241 Localized swelling, mass and lump, right lower limb: Secondary | ICD-10-CM

## 2022-01-04 ENCOUNTER — Ambulatory Visit: Payer: Self-pay | Attending: Internal Medicine | Admitting: Occupational Therapy

## 2022-01-04 DIAGNOSIS — I890 Lymphedema, not elsewhere classified: Secondary | ICD-10-CM | POA: Insufficient documentation

## 2022-01-06 ENCOUNTER — Encounter: Payer: Self-pay | Admitting: Occupational Therapy

## 2022-01-06 NOTE — Addendum Note (Signed)
Addended by: Ansel Bong on: 01/06/2022 10:16 AM   Modules accepted: Orders

## 2022-01-06 NOTE — Therapy (Addendum)
[place] [facility] [address] [place], [place], [address] Phone: [phone]   Fax:  [phone]  Occupational Therapy Evaluation  Patient Details  Name: Leslie Chapman MRN: [id number] Date of Birth: July 14, 1980 Referring Provider (OT): Delsa Grana, [place]   Encounter Date: 01/04/2022   OT End of Session - 01/06/22 1008     Visit Number 1    Number of Visits 1    Date for OT Re-Evaluation 01/04/22    OT Start Time 0100    OT Stop Time 0200    OT Time Calculation (min) 60 min    Activity Tolerance Patient tolerated treatment well;No increased pain    Behavior During Therapy WFL for tasks assessed/performed             Past Medical History:  Diagnosis Date   SVT (supraventricular tachycardia)     Past Surgical History:  Procedure Laterality Date   CESAREAN SECTION  2010   x 1    There were no vitals filed for this visit.   Subjective Assessment - 01/06/22 0829     Subjective  Leslie Chapman is referred to Occupational Therapy by Delsa Grana, PA-C, for evaluation and treatment of BLE lymphedema (LE).  Leslie Chapman reports fluctuating swelling in her legs started ,  "years ago in my early 20's" without known precipitating event. She knows of no known family history of limb swelling. She reports she had a lot of leg swelling in her legs with her first pregnancy at age 41, and with 3 subsequent pregnancies.She reports she also has unexplained red, itchy, burning rashes on her arms and legs when she touches everyday things,  like a suitcase handle. She endorses joint pain. She has not had a  rheumatology work up for [employer]. Pt reports Dopler study 1 year ago was negative for DVT. She has had lymph massage, but not formal Complete Decongestive Therapy (CDT), the current gold standard of lymphedema care. She  has worn  off the shelf compression stockings, and she does have a basic vasopneumatic "pump". Despite  these tools and strategies, to date Leslie Chapman is unable to control leg swelling. Pt's goal for OT and LE care are," to find out what is causing swelling in my legs."    Pertinent History Hx of SVT (supraventricular tachycardia); Obesity    Limitations impaired standing, walking and sitting tolerance, difficulty fitting preferred street shoes due to swelling    Special Tests slightly + Stemmer sign base of toes bilaterally    Currently in Pain? Yes   not rated numerically   Pain Location Leg    Pain Orientation Right;Left    Pain Descriptors / Indicators Tiring;Discomfort;Heaviness;Tightness    Pain Type Chronic pain    Pain Onset Other (comment)   onset early 20's   Pain Frequency Intermittent    Aggravating Factors  standing, walking, dependent sitting, exercising, hot weather    Pain Relieving Factors elevation,    Effect of Pain on Daily Activities Chronic leg swelling and associated pain limits tolerance functional ambulation, limits basic and instrumental ADLs ( fitting shoes and lower body clothng, distance driving and car travel, home management activities, shopping) , limits work and productive activities requiring standing, walking and/or extensive dependednt sitting, Limits social participation, leisure p0ursuits and social participation at home and in the community    Multiple Pain Sites Yes               Julian OT  Assessment - 01/06/22 0001       Assessment   Medical Diagnosis Mild, stage II, BLE lymphedema 2/2 Obesity and other unknown etiology. Suspect vascular involvement and possibly autoimmune contribution    Referring Provider (OT) Delsa Grana, PA-C    Onset Date/Surgical Date 01/04/22    Prior Therapy MLD by CMT (no Rx response) , OTS compression knee highs (ccl ?)      Precautions   Precautions --   Hx Tachycardi     Restrictions   Weight Bearing Restrictions No      Balance Screen   Has the patient fallen in the past 6 months No    Has the patient had a  decrease in activity level because of a fear of falling?  Yes    Is the patient reluctant to leave their home because of a fear of falling?  No      Home  Environment   Lives With Spouse;Daughter      Prior Function   Level of Independence Independent;Independent with basic ADLs;Independent with household mobility without device;Independent with community mobility without device;Independent with homemaking with ambulation;Independent with gait;Independent with transfers    Vocation Self employed    Vocation Requirements primarily sedentary, some walking and standing    Leisure family, cruise      IADL   Prior Level of Function Shopping I    Shopping Takes care of all shopping needs independently   requires seated rest breaks  with elevation due to leg and foot pain with extended stabding, walking and dependent sitting.   Prior Level of Function Light Housekeeping I    Light Housekeeping Maintains house alone or with occasional assistance   requires seated rest breaks  with elevation due to leg and foot pain with extended stabding, walking and dependent sitting.   Prior Level of Function Meal Prep I    Meal Prep Plans, prepares and serves adequate meals independently   requires seated rest breaks  with elevation due to leg and foot pain with extended stabding, walking and dependent sitting.   Prior Level of Function Community Mobility I    Community Mobility Drives own vehicle   requires seated rest breaks  with elevation due to leg and foot pain with extended stabding, walking and dependent sitting.     Mobility   Mobility Status Independent      Vision - History   Baseline Vision No visual deficits      Activity Tolerance   Activity Tolerance Comments deacreased standing , walking and dependent sitting tolerancre limits activity tolerabce      Cognition   Overall Cognitive Status Within Functional Limits for tasks assessed      Observation/Other Assessments   Focus on Therapeutic  Outcomes (FOTO)  TBA    Other Surveys  --   LLIS Intake score: 75?100 ( the extennt to which lymphedema trekated proboleme negatively impacted your life over the past week)   Outcome Measures Lymphedema Life Impact Scale (LLIS) : INtake score: 75/100%      (the extent to which lymphedema-related problems negatively impacted you life over the past week      Posture/Postural Control   Posture/Postural Control No significant limitations      Sensation   Light Touch Appears Intact      Coordination   Gross Motor Movements are Fluid and Coordinated Yes      Hand Function   Right Hand Gross Grasp Functional    Right Hand Grip (lbs) functional  Left Hand Gross Grasp Functional            Mild, Stage  II, Bilateral Lower Extremity Lymphedema 2/2 Obesity, suspected vasular  disease/ component and underlying chronic inflammation  Skin  Description Hyper-Keratosis Peau' de Orange Shiny Tight Fibrotic/ Indurated Fatty Doughy Spongy/ boggy       Slighy increased tissue density at distal legs and ankles, R>L x  x   Skin dry Flaky WNL Macerated     x    Color Redness Varicosities Blanching Hemosiderin Stain Mottled           Odor Malodorous Yeast Fungal infection  WNL      x   Temperature Warm Cool wnl      x   Pitting Edema   1+ 2+ 3+ 4+ Non-pitting         x   Girth Symmetrical Asymmetrical                   Distribution   x      Stemmer Sign Positive Negative   Slight + at base of toes, R>L    Lymphorrhea History Of:  Present Absent     x    Wounds History Of Present Absent Venous Arterial Pressure Sheer     x        Signs of Infection Redness Warmth Erythema Acute Swelling Drainage Borders                    Sensation Light Touch Deep pressure Hypersensitivty   Present Impaired Present Impaired Absent Impaired   x  x  x     Nails WNL   Fungus nail dystrophy   x     Hair Growth Symmetrical Asymmetrical   x    Skin Creases Base of toes   Ankles   Base of Fingers knees       Abdominal pannus Thigh Lobules  Face/neck                     OT Treatments/Exercises (OP) - 01/06/22 0001       ADLs   Overall ADLs needs seated restbreaks periodically for all basic and ibnstrumental activities requiring extended standing, walking and/ or dependent sitting. (Modified independent- extra time)    LB Dressing difficulty fitting street shoes and LB clothing due to leg swelling and assocoated pain    Driving Mod I    Work Mod I    ADL Education Given Yes      Manual Therapy   Manual Therapy Edema management                    OT Education - 01/06/22 0925     Education Details Provided Pt/ family education regarding lymphatic structure and function, etiology, onset patterns and stages of progression.  Taught differential dx for systemic swelling vs lymphedema. Taught interaction between blood circulatory system and lymphatic circulation.Discussed  impact of gravity and co-morbidities on lymphatic function. Outlined Complete Decongestive Therapy (CDT)  as standard of care and provided in depth information regarding 4 primary components of Intensive and Self Management Phases, including Manual Lymph Drainage (MLD), compression wrapping and garments, skin care, and therapeutic exercise. Pilar Plate discussion with re need for frequent attendance and high burden of care when caregiver is needed, impact of co morbidities. We discussed  the chronic, progressive nature of lymphedema and Importance of daily, ongoing LE self-care essential for limiting progression and infection risk.  Discussed the demanding nature of CDT and importance of high compliance with all home program components for optimal outcome.    Person(s) Educated Patient    Methods Explanation;Demonstration;Handout    Comprehension Verbalized understanding;Returned demonstration              OT Short Term Goals - 01/06/22 0941       OT SHORT TERM GOAL #1    Title Pt will verbalize understanding of lymphedema precautions and  prevention strategiesand signs and symptoms of cellulitis infection  with modified independence (using printed reference to reduce risk of progression and to limit infection risk.    Baseline Max A    Time 1    Period Days    Status Achieved    Target Date 01/04/22                      Plan - 01/06/22 6144     Clinical Impression Statement Aliza Moret presents with mild, stage I/II, BLE lymphedema 2/2 obesity, suspected venous  factors and possibly other unknown , underlying, inflammatory processes in the body. Although Pt fits the early adulthood onset pattern during young aduthood (lymphedema Preacox) , and Pt does present with some increased spongey, boggy tissue density in distal legs, she does not present with skin changes associated with chronic, progressive lymphedema. While limb swelling has persisted, tissue fibrosis is relatively mild for reported onset s/p 26 years. . Pt will benefit from fitting with appropriate, well fitting, ccl 2 ( 30-40) mmHg) compression knee highs for full-time daily use to manage swelling and reduce pain. At opresent  CDT is not indicated.  Pt will benefit from medical support for weight loss and nutrition, She will also  benefit from further workup to identify underlying inflammatory process resulting in chronic pain and swelling in legs and feet. Pt is discharged from Occupational Therapy for Lymphedema care. Thank you for this referral. Happy to provide services in the future PRN.    OT Occupational Profile and History Detailed Assessment- Review of Records and additional review of physical, cognitive, psychosocial history related to current functional performance    Occupational performance deficits (Please refer to evaluation for details): ADL's;IADL's;Rest and Sleep;Work;Leisure;Social Participation    Body Structure / Function / Physical Skills ADL;Edema;Skin  integrity;Obesity;Pain;Decreased knowledge of precautions;Decreased knowledge of use of DME;IADL    Rehab Potential Fair    Clinical Decision Making Limited treatment options, no task modification necessary    Comorbidities Affecting Occupational Performance: Presence of comorbidities impacting occupational performance    Comorbidities impacting occupational performance description: obesity    Modification or Assistance to Complete Evaluation  Min-Moderate modification of tasks or assist with assess necessary to complete eval    OT Frequency One time visit    OT Treatment/Interventions Self-care/ADL training;DME and/or AE instruction;Patient/family education;Compression bandaging;Therapeutic activities;Coping strategies training    Plan Allysha Tryon will benefit from fitting with appropriate, well fitting and comfortable compression stockings to reduce leg pain and discomfort and limit skin changes associated with chronic, protien rich leg swelling. Start with ccl 2 (30-40 mmHg) iknee high, Juzo SOFT . I am happy to assist Pt with obtaining and fitting recommended compression garments, Tr Present she denies this plan, opting instead to pursue further workup of possible underlying inflammatory conditions. Challis Crill is discharged from OT this date. I am happy to assist in the future should a need arise.    Consulted and Agree with Plan of Care Patient  Patient will benefit from skilled therapeutic intervention in order to improve the following deficits and impairments:   Body Structure / Function / Physical Skills: ADL, Edema, Skin integrity, Obesity, Pain, Decreased knowledge of precautions, Decreased knowledge of use of DME, IADL       Visit Diagnosis: Lymphedema, not elsewhere classified    Problem List Patient Active Problem List   Diagnosis Date Noted   History of COVID-19 06/26/2019   Breast skin changes 07/08/2015   Neoplasm of uncertain behavior of skin of  abdomen 07/08/2015   Tachycardia 12/04/2014   Preventative health care 12/04/2014   Bilateral leg edema 12/04/2014   History of anemia 12/04/2014   Atypical chest pain 12/04/2014   Pregnant and not yet delivered in first trimester 12/04/2014   Morbid obesity ([place]) 12/04/2014   Chest pain 11/19/2010   Andrey Spearman, MS, OTR/L, [facility] 01/06/22 10:12 AM   [place], [place], [address] Phone: [phone]   Fax:  [phone]  Name: Rashana Andrew MRN: [id number] Date of Birth: 1980/05/27

## 2022-01-07 ENCOUNTER — Ambulatory Visit: Payer: Self-pay | Admitting: Occupational Therapy

## 2022-01-12 ENCOUNTER — Encounter: Payer: 59 | Admitting: Occupational Therapy

## 2022-01-14 ENCOUNTER — Encounter: Payer: 59 | Admitting: Occupational Therapy

## 2022-01-19 ENCOUNTER — Encounter: Payer: 59 | Admitting: Occupational Therapy

## 2022-01-21 ENCOUNTER — Encounter: Payer: 59 | Admitting: Occupational Therapy

## 2022-01-25 ENCOUNTER — Encounter: Payer: 59 | Admitting: Occupational Therapy

## 2022-01-28 ENCOUNTER — Encounter: Payer: 59 | Admitting: Occupational Therapy

## 2022-02-01 ENCOUNTER — Encounter: Payer: 59 | Admitting: Occupational Therapy

## 2022-02-03 ENCOUNTER — Encounter: Payer: 59 | Admitting: Occupational Therapy

## 2022-02-08 ENCOUNTER — Encounter: Payer: 59 | Admitting: Occupational Therapy

## 2022-02-15 ENCOUNTER — Encounter: Payer: 59 | Admitting: Occupational Therapy

## 2022-02-17 ENCOUNTER — Encounter: Payer: 59 | Admitting: Occupational Therapy

## 2022-02-22 ENCOUNTER — Encounter: Payer: 59 | Admitting: Occupational Therapy

## 2022-02-24 ENCOUNTER — Encounter: Payer: 59 | Admitting: Occupational Therapy

## 2022-03-01 ENCOUNTER — Encounter: Payer: 59 | Admitting: Occupational Therapy

## 2022-03-03 ENCOUNTER — Encounter: Payer: 59 | Admitting: Occupational Therapy

## 2022-03-08 ENCOUNTER — Encounter: Payer: 59 | Admitting: Occupational Therapy

## 2022-03-10 ENCOUNTER — Encounter: Payer: 59 | Admitting: Occupational Therapy

## 2022-04-07 IMAGING — MG MM DIGITAL DIAGNOSTIC UNILAT*R* W/ TOMO W/ CAD
6 of 12 series · 6 of 36 positions shown · non-contrast
Comparison: Previous exam(s).

CLINICAL DATA: Patient recalled from screening for right breast
asymmetry. Additionally, patient reports small amount of redness and
itchiness involving the right nipple.

EXAM:
DIGITAL DIAGNOSTIC RIGHT MAMMOGRAM WITH CAD AND TOMO
ULTRASOUND RIGHT BREAST

[R ML synth-2D]
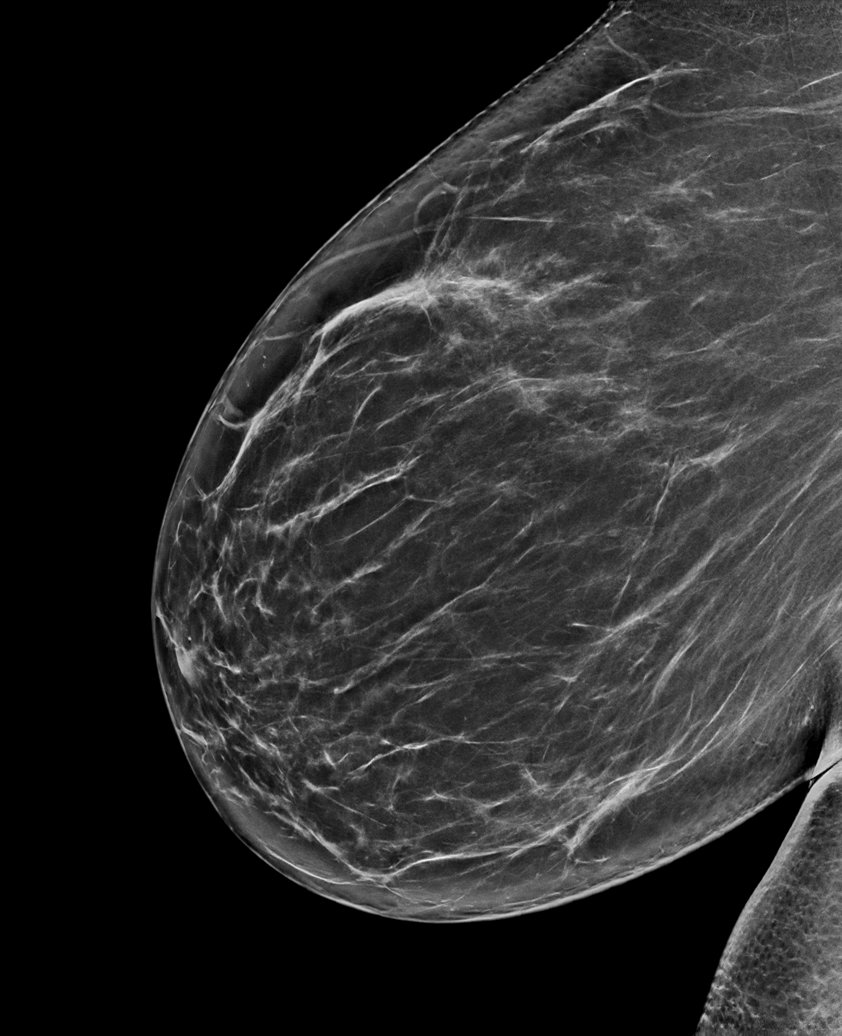

[R CC synth-2D (1 of 3)]
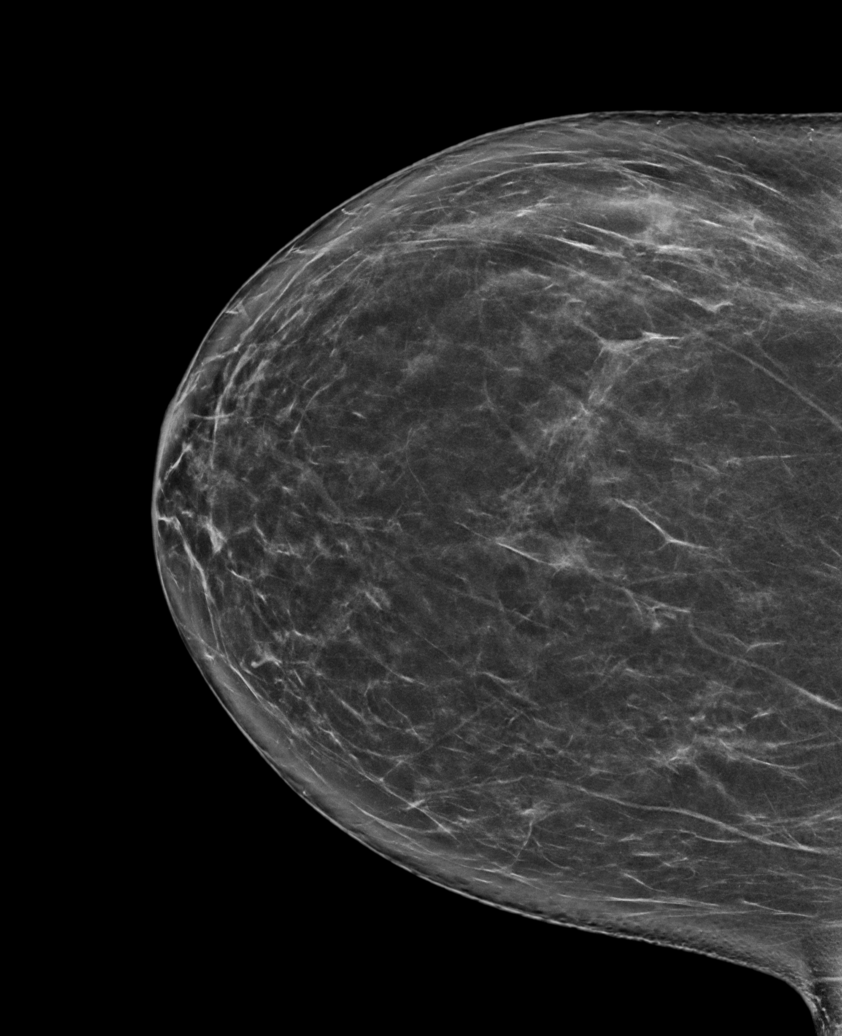

[R MLO synth-2D (1 of 2)]
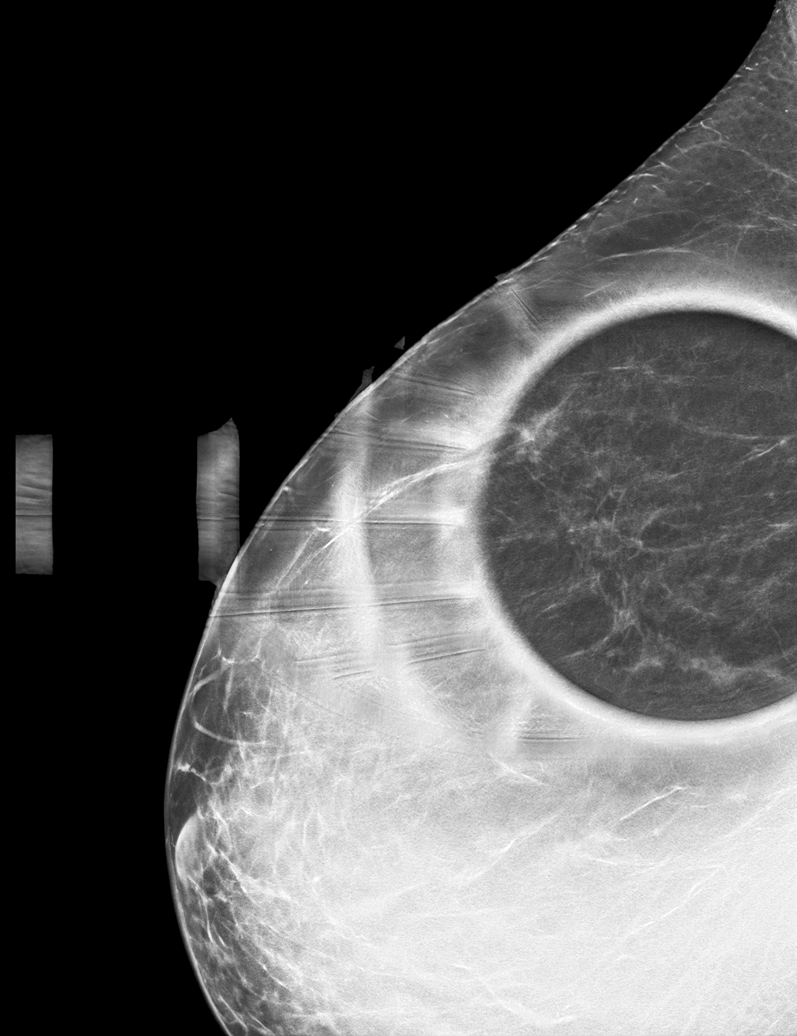

[R CC synth-2D (2 of 3)]
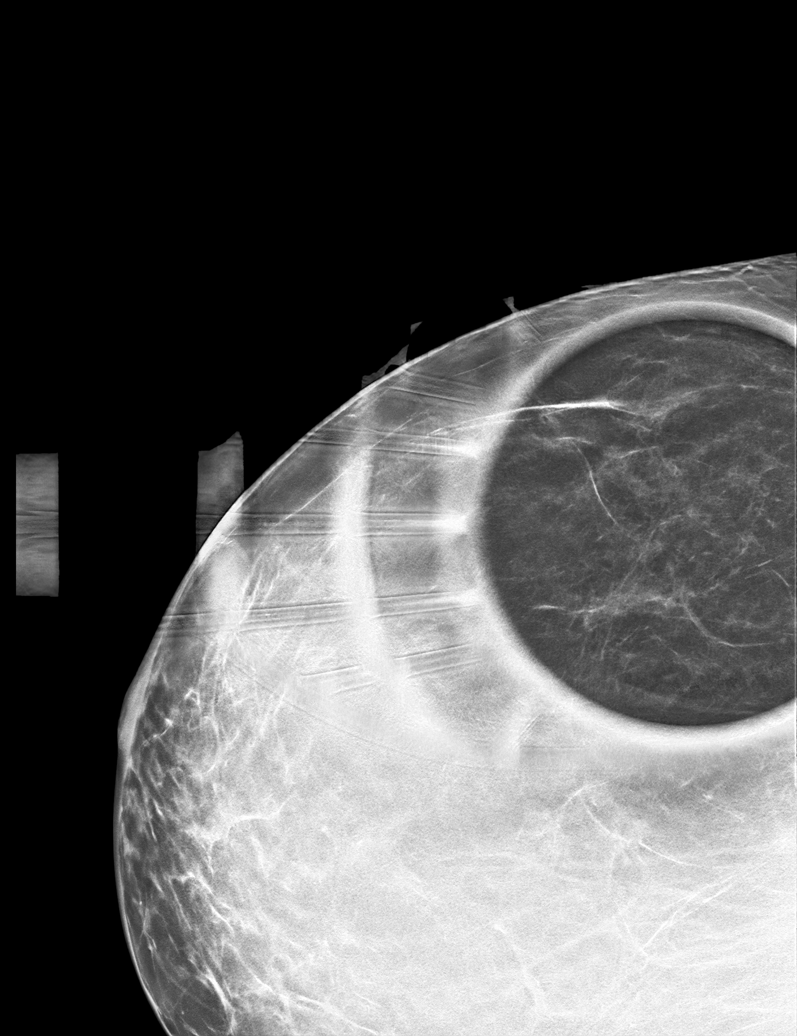

[R CC synth-2D (3 of 3)]
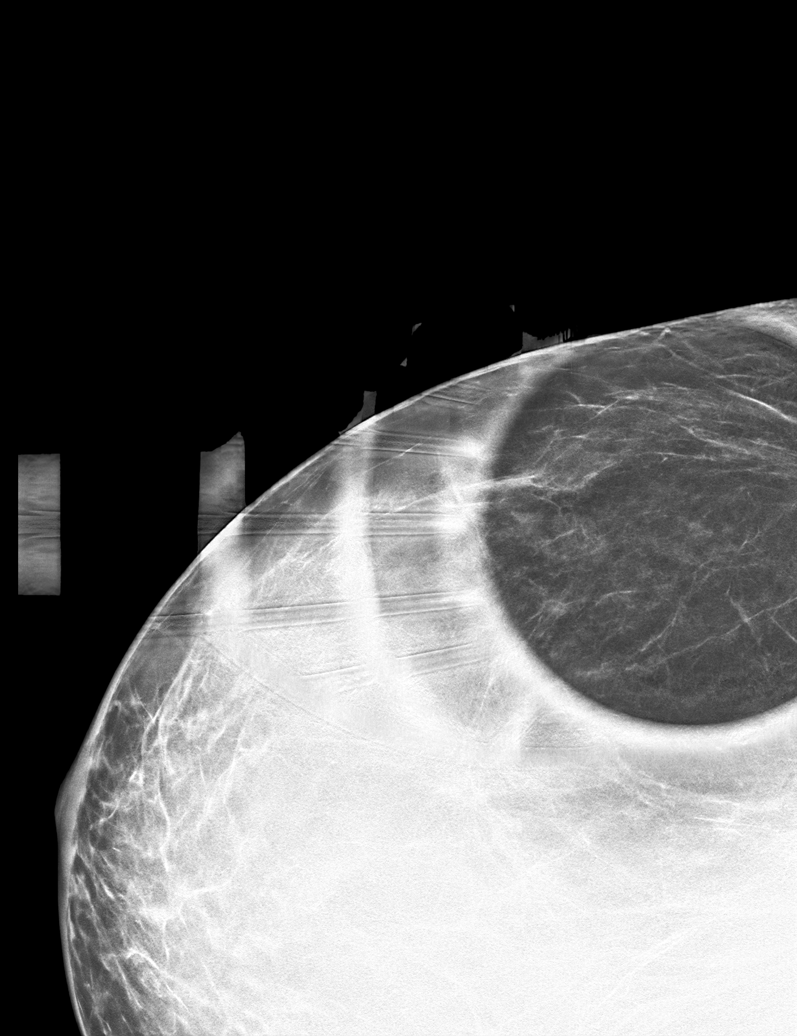

[R MLO synth-2D (2 of 2)]
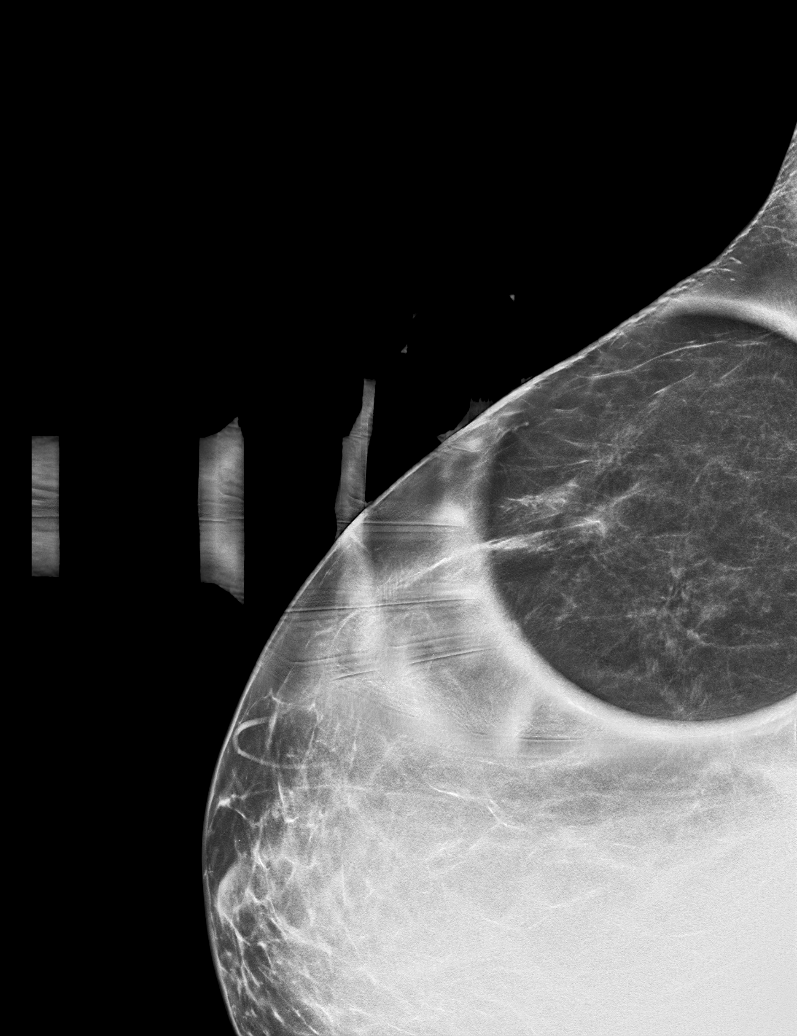

[6 of 36 positions shown; findings below may reference images not displayed]

ACR Breast Density Category b: There are scattered areas of
fibroglandular density.
FINDINGS: Questioned asymmetry within the upper-outer right breast
predominately effaced with additional imaging suggestive of dense
fibroglandular tissue.

Mammographic images were processed with CAD.

On physical exam, there is a small amount of cutaneous redness
involving the superior aspect of the right areola.

Targeted ultrasound is performed, showing normal dense tissue
without suspicious mass within the upper-outer right breast.
IMPRESSION: Indeterminate small focal area of redness involving the superior
aspect of the right areola.

No mammographic evidence for malignancy within the right or left
breast.

RECOMMENDATION:
Dermatologic and or surgical consultation for further evaluation of
right areolar redness. Recommend continued clinical follow-up until
complete resolution. If treatment options do not resolve the issue,
consider further evaluation with skin punch biopsy to eliminate the
possibility of a malignant process/Paget's disease.

Annual screening mammography at age 41.

I have discussed the findings and recommendations with the patient.
If applicable, a reminder letter will be sent to the patient
regarding the next appointment.

BI-RADS CATEGORY  2: Benign.

## 2022-07-05 ENCOUNTER — Ambulatory Visit (HOSPITAL_BASED_OUTPATIENT_CLINIC_OR_DEPARTMENT_OTHER): Admit: 2022-07-05 | Payer: Self-pay | Admitting: Obstetrics and Gynecology

## 2022-07-05 ENCOUNTER — Encounter (HOSPITAL_BASED_OUTPATIENT_CLINIC_OR_DEPARTMENT_OTHER): Payer: Self-pay

## 2022-07-05 SURGERY — HYSTERECTOMY, TOTAL, ROBOT-ASSISTED, LAPAROSCOPIC, WITH BILATERAL SALPINGO-OOPHORECTOMY
Anesthesia: General | Laterality: Bilateral

## 2024-03-12 ENCOUNTER — Other Ambulatory Visit (HOSPITAL_BASED_OUTPATIENT_CLINIC_OR_DEPARTMENT_OTHER): Payer: Self-pay
